# Patient Record
Sex: Female | Born: 1975
Health system: Southern US, Community
[De-identification: ages and names within clinical notes are randomized; demographics above are authoritative.]

## PROBLEM LIST (undated history)

## (undated) DIAGNOSIS — E079 Disorder of thyroid, unspecified: Secondary | ICD-10-CM

## (undated) DIAGNOSIS — Z803 Family history of malignant neoplasm of breast: Secondary | ICD-10-CM

## (undated) DIAGNOSIS — F329 Major depressive disorder, single episode, unspecified: Secondary | ICD-10-CM

## (undated) DIAGNOSIS — R2240 Localized swelling, mass and lump, unspecified lower limb: Secondary | ICD-10-CM

## (undated) DIAGNOSIS — F32A Depression, unspecified: Secondary | ICD-10-CM

## (undated) HISTORY — DX: Family history of malignant neoplasm of breast: Z80.3

## (undated) HISTORY — DX: Major depressive disorder, single episode, unspecified: F32.9

## (undated) HISTORY — DX: Disorder of thyroid, unspecified: E07.9

## (undated) HISTORY — DX: Depression, unspecified: F32.A

## (undated) HISTORY — DX: Localized swelling, mass and lump, unspecified lower limb: R22.40

---

## 1994-12-30 HISTORY — PX: FRACTURE SURGERY: SHX138

## 2001-05-12 ENCOUNTER — Other Ambulatory Visit: Admission: RE | Admit: 2001-05-12 | Discharge: 2001-05-12 | Payer: Self-pay | Admitting: Obstetrics and Gynecology

## 2001-11-23 ENCOUNTER — Inpatient Hospital Stay (HOSPITAL_COMMUNITY): Admission: AD | Admit: 2001-11-23 | Discharge: 2001-11-23 | Payer: Self-pay | Admitting: Obstetrics and Gynecology

## 2001-11-27 ENCOUNTER — Inpatient Hospital Stay: Admission: AD | Admit: 2001-11-27 | Discharge: 2001-11-27 | Payer: Self-pay | Admitting: Pediatrics

## 2001-11-30 ENCOUNTER — Inpatient Hospital Stay (HOSPITAL_COMMUNITY): Admission: AD | Admit: 2001-11-30 | Discharge: 2001-12-02 | Payer: Self-pay | Admitting: Obstetrics & Gynecology

## 2002-01-12 ENCOUNTER — Other Ambulatory Visit: Admission: RE | Admit: 2002-01-12 | Discharge: 2002-01-12 | Payer: Self-pay | Admitting: Obstetrics and Gynecology

## 2003-02-15 ENCOUNTER — Other Ambulatory Visit: Admission: RE | Admit: 2003-02-15 | Discharge: 2003-02-15 | Payer: Self-pay | Admitting: Obstetrics and Gynecology

## 2004-04-23 ENCOUNTER — Other Ambulatory Visit: Admission: RE | Admit: 2004-04-23 | Discharge: 2004-04-23 | Payer: Self-pay | Admitting: Obstetrics and Gynecology

## 2005-10-22 ENCOUNTER — Other Ambulatory Visit: Admission: RE | Admit: 2005-10-22 | Discharge: 2005-10-22 | Payer: Self-pay | Admitting: Obstetrics and Gynecology

## 2009-02-24 ENCOUNTER — Other Ambulatory Visit: Admission: RE | Admit: 2009-02-24 | Discharge: 2009-02-24 | Payer: Self-pay | Admitting: Family Medicine

## 2010-05-30 ENCOUNTER — Other Ambulatory Visit: Admission: RE | Admit: 2010-05-30 | Discharge: 2010-05-30 | Payer: Self-pay | Admitting: Family Medicine

## 2011-06-03 ENCOUNTER — Other Ambulatory Visit (HOSPITAL_COMMUNITY)
Admission: RE | Admit: 2011-06-03 | Discharge: 2011-06-03 | Disposition: A | Discharge status: Home / Self Care | Payer: BC Managed Care – PPO | Source: Ambulatory Visit | Attending: Family Medicine | Admitting: Family Medicine

## 2011-06-03 ENCOUNTER — Other Ambulatory Visit: Payer: Self-pay | Admitting: Physician Assistant

## 2011-06-03 DIAGNOSIS — Z124 Encounter for screening for malignant neoplasm of cervix: Secondary | ICD-10-CM | POA: Insufficient documentation

## 2011-07-18 ENCOUNTER — Encounter (INDEPENDENT_AMBULATORY_CARE_PROVIDER_SITE_OTHER): Payer: Self-pay | Admitting: General Surgery

## 2011-07-19 ENCOUNTER — Ambulatory Visit (INDEPENDENT_AMBULATORY_CARE_PROVIDER_SITE_OTHER): Payer: BC Managed Care – PPO | Admitting: General Surgery

## 2011-07-19 ENCOUNTER — Encounter (INDEPENDENT_AMBULATORY_CARE_PROVIDER_SITE_OTHER): Payer: Self-pay | Admitting: General Surgery

## 2011-07-19 VITALS — BP 110/68 | HR 88 | Temp 97.1°F | Resp 18 | Ht 62.0 in | Wt 201.0 lb

## 2011-07-19 DIAGNOSIS — I83899 Varicose veins of unspecified lower extremities with other complications: Secondary | ICD-10-CM

## 2011-07-19 DIAGNOSIS — I83893 Varicose veins of bilateral lower extremities with other complications: Secondary | ICD-10-CM

## 2011-07-19 NOTE — Progress Notes (Signed)
Subjective:     Patient ID: Emma [name], female   DOB: 1976-11-17, 35 y.o.   MRN: [id number]  HPI  At the 66 year old mother of 2 is referred by Dr. Sherlyn Lick from [facility] for a subcutaneous mass on the left leg that she first noticed approximately 2 months ago she said that when it first was noticed it was about the size of a quarter since then its increased and looks like it's convoluted tortuous dilated superficial varicose vein to me that she's not had any previous problems or varicose veins or wear him. She has a low particular pain suspicious trying to lose weight and she's also birth control pills. A Dr. Mignon Pine examine her and he was in agreement that this is a superficial thrombosis vein the area is slightly tender and on ultrasound and you can see its vascular structure that looked like a scalpel superficial clot in it. Review of Systems Patient is on birth control pills has had no real problems with her extremities as far as significant pain but stated that she has noticed that she is having fluid retention and swelling in her lower extremities more so recently with the hot weather. She did not have problems with death of varicose veins with her 2 pregnancies and her last pregnancy was 35 years old.    Objective:   Physical Exam Physical examination but are limited to the lower extremities she is not got really pitting edema and on examination of her leg standing and lying flat little area does increase in size with a tourniquet and with her standing that will decreased slightly. Area is slightly tender and you can feel some deeper veins at are not painful in the posterior calf. I cannot appreciate any varicosities in the this superficial venous system. In the right lower extremity in a symmetrical area you can visualize the vein but this doesn't have a thrombus in it.  A copy of the ultrasound was placed in her chart.  Her pulses are good she doesn't having problems or  claudication.     Assessment:        Plan:     However recommend that we start taking of adult aspirin one or 2 times a day try to avoid prolonged sitting S4s wearing support stockings I would not do the abdomen is hot weather. I do want to reexamine her approximately 2 weeks and if the area is decrease in on its on and probably nothing further will need to be done discussed with her that the varicose vein surgery is now performed by the peripheral asked the surgeon's and if she is having flow problems may need to see Dr. earlier Hart [place].  I like to see her for followup and then we will go accordingly the little area is large enough that it may need to be lanced all removed for cosmetic reasons if it doesn't resolve on its signs like to try the conservative management first thank you

## 2011-07-26 ENCOUNTER — Ambulatory Visit (INDEPENDENT_AMBULATORY_CARE_PROVIDER_SITE_OTHER): Payer: Self-pay | Admitting: General Surgery

## 2011-08-08 ENCOUNTER — Encounter (INDEPENDENT_AMBULATORY_CARE_PROVIDER_SITE_OTHER): Payer: Self-pay | Admitting: General Surgery

## 2011-08-09 ENCOUNTER — Encounter (INDEPENDENT_AMBULATORY_CARE_PROVIDER_SITE_OTHER): Payer: BC Managed Care – PPO | Admitting: General Surgery

## 2011-09-10 ENCOUNTER — Encounter (INDEPENDENT_AMBULATORY_CARE_PROVIDER_SITE_OTHER): Payer: BC Managed Care – PPO | Admitting: General Surgery

## 2011-10-02 ENCOUNTER — Encounter (INDEPENDENT_AMBULATORY_CARE_PROVIDER_SITE_OTHER): Payer: BC Managed Care – PPO | Admitting: General Surgery

## 2011-10-22 ENCOUNTER — Encounter (INDEPENDENT_AMBULATORY_CARE_PROVIDER_SITE_OTHER): Payer: Self-pay | Admitting: General Surgery

## 2011-10-22 ENCOUNTER — Ambulatory Visit (INDEPENDENT_AMBULATORY_CARE_PROVIDER_SITE_OTHER): Payer: BC Managed Care – PPO | Admitting: General Surgery

## 2011-10-22 VITALS — BP 128/86 | HR 100 | Temp 96.9°F | Resp 16 | Ht 62.0 in | Wt 189.8 lb

## 2011-10-22 DIAGNOSIS — I8310 Varicose veins of unspecified lower extremity with inflammation: Secondary | ICD-10-CM

## 2011-10-22 DIAGNOSIS — I8312 Varicose veins of left lower extremity with inflammation: Secondary | ICD-10-CM

## 2011-10-22 NOTE — Progress Notes (Signed)
Subjective:     Patient ID: Emma [name], female   DOB: 06/22/1976, 35 y.o.   MRN: [id number]  HPIThis is a 35 year old female that I saw in July when she had a lump arising on the pretibial area just to the left midline that so I felt like a thrombosed varicose vein and we did ultrasound of the area and I think I got my partners to reexamine her subcutaneous mass and I recommend that we not removed the area that place her on aspirin twice a day some compression to the air the examiner she returns now approximately 3 months later and the actual mass has disappeared but there is an area of erythema right at the lateral edges to come is a broad area but there is no definite mass and when you examine the area with the ultrasound you again and see the vein tortuous and through the area but it does not enlarge in size when she stands like it use to. I think that this pain is gross and and she is asking about a little red edges and I had Dr. Daphine Deutscher examiner and he was not as impressive and it was very varicose veins but he was did not see her the last visit but was of the opinion that he would not recommend any type of attempt at sclerotherapy of injecting the varicose vein since the patient's concern now is as more of a slight area of erythema. She has lost 11 pounds with diet and this time for a is on thyroid with placement medication but no evidence of any of the skin areas. Dr. Daphine Deutscher was one about a little punch biopsy of ischemia since the erythema was noticed to be some type of skin fenestration of her endocrine problem but I expected with a level of the varicose vein that had sclerosed spontaneously. The patient does not have significant varicose veins in the greater saphenous or lesser saphenous in that area and we never appreciated an area of traumatic injury to the area  Review of Systems Current Outpatient Prescriptions  Medication Sig Dispense Refill  . levonorgestrel-ethinyl estradiol (ENPRESSE)  per tablet Take 1 tablet by mouth daily.        Marland Kitchen levothyroxine (SYNTHROID, LEVOTHROID) 50 MCG tablet Take 50 mcg by mouth daily.        . sertraline (ZOLOFT) 50 MG tablet Take 50 mg by mouth daily.             Objective:   Physical ExamBP 128/86  Pulse 100  Temp(Src) 96.9 F (36.1 C) (Temporal)  Resp 16  Ht 5\' 2"  (1.575 m)  Wt 189 lb 12.8 oz (86.093 kg)  BMI 34.71 kg/m2  Patient has a erythematous measuring abut 2-1/2 inches high about 2 inches that you can feelVein inner and superiorly and to the lateral aspect in the erythema blanches a few elevated her leg is more prominent when is in a dependent position. It's not acutely tender likely has a low-grade infection in our recommended we just reexamine her in 2 months if the area is resolved and not do any biopsy or injections of the varicose vein at this time. She needs to be encouraged for weight loss and hopefully will continue on this weight diet and pattern and see me in 2 months. I did not stripper down but she states she has no other areas on the lower extremity is other areas her body a similar little erythematous area.    Assessment:  Resolve and cluster of varicose veins left lower extremity is slight erythema of the surrounding skin. The low pole varix appeared just to be decrease in inside documented with the ultrasound     Plan:     Return in 2 months

## 2011-10-22 NOTE — Patient Instructions (Signed)
Continue with her diet and lower leg management in the PCU in 2 months. If the peritoneum off of edema is increasing in return to see me sooner.

## 2011-12-20 ENCOUNTER — Encounter (INDEPENDENT_AMBULATORY_CARE_PROVIDER_SITE_OTHER): Payer: Self-pay | Admitting: General Surgery

## 2011-12-20 ENCOUNTER — Ambulatory Visit (INDEPENDENT_AMBULATORY_CARE_PROVIDER_SITE_OTHER): Payer: BC Managed Care – PPO | Admitting: General Surgery

## 2011-12-20 VITALS — BP 118/90 | HR 80 | Temp 97.0°F | Resp 18 | Ht 62.0 in | Wt 193.2 lb

## 2011-12-20 DIAGNOSIS — I8312 Varicose veins of left lower extremity with inflammation: Secondary | ICD-10-CM | POA: Insufficient documentation

## 2011-12-20 DIAGNOSIS — I8310 Varicose veins of unspecified lower extremity with inflammation: Secondary | ICD-10-CM

## 2011-12-20 NOTE — Progress Notes (Signed)
Patient ID: Emma [name], female   DOB: 1976/04/30, 35 y.o.   MRN: [id number] BP 118/90  Pulse 80  Temp(Src) 97 F (36.1 C) (Temporal)  Resp 18  Ht 5\' 2"  (1.575 m)  Wt 193 lb 3.2 oz (87.635 kg)  BMI 35.34 kg/m2 Emma [name] returns and states that the area on the left leg has not been a swollen but there still are area of mild erythema of a central area of that no longer erythematous but she is developed venous varicosities around the ankle and in other areas of the left leg on ultrasound of the area today you can see nondilated vein and the immediate subcutaneous area but there is no definite mass lipoma-like and feeling area and a few place a tourniquet of pain have her stay on the veins to become more prominent. I think that this is a problem of venous varicosities and would like for her to be evaluated by Dr. Gretta Began and see if problems could be prevented with the laser outpatient surgery or at least tall to him. She states she is trying to lose weight without much succes but is not planning any future pregnancies. When I originally first saw her there was an area of swelling like acute venous varicosities these have resolved but there still this mild area of erythema with slight central discoloration of the skin. I did not take any photographs today. The varicosities on the right lower extremity are much less. Dr. early feels that this is not a problem with venous varicosities Dr. Mignon Pine has seen Emma. [name] previously with me and she will call to be seen by Dr. Mignon Pine if things become worse.

## 2011-12-20 NOTE — Patient Instructions (Signed)
Calling upon with Dr. Tawanna Cooler Early in the next few weeks so he can do an ultrasound evaluation and decide high at this point the best managed with the ultrasound. If they feel that this is not a problem that they can manage return to see Dr. Luisa Hart reviewed met with the previous visit

## 2012-06-04 ENCOUNTER — Ambulatory Visit (INDEPENDENT_AMBULATORY_CARE_PROVIDER_SITE_OTHER): Payer: BC Managed Care – PPO | Admitting: Emergency Medicine

## 2012-06-04 VITALS — BP 107/71 | HR 80 | Temp 98.0°F | Resp 16 | Ht 63.0 in | Wt 192.0 lb

## 2012-06-04 DIAGNOSIS — J029 Acute pharyngitis, unspecified: Secondary | ICD-10-CM

## 2012-06-04 DIAGNOSIS — J018 Other acute sinusitis: Secondary | ICD-10-CM

## 2012-06-04 MED ORDER — DM-GUAIFENESIN ER 60-1200 MG PO TB12: 1.0000 | ORAL_TABLET | Freq: Two times a day (BID) | ORAL | Status: AC

## 2012-06-04 MED ORDER — AMOXICILLIN-POT CLAVULANATE 875-125 MG PO TABS: 1.0000 | ORAL_TABLET | Freq: Two times a day (BID) | ORAL | Status: AC

## 2012-06-04 NOTE — Progress Notes (Signed)
  Subjective:    Patient ID: Emma [name], female    DOB: [birth year], 36 y.o.   MRN: [id number]  Fever  This is a new problem. The current episode started yesterday. The problem occurs constantly. The problem has been gradually worsening. The maximum temperature noted was 102 to 102.9 F. The temperature was taken using an oral thermometer. Associated symptoms include congestion, coughing, diarrhea, ear pain and a sore throat. Pertinent negatives include no headaches. She has tried nothing for the symptoms. The treatment provided no relief.  Sore Throat  This is a new problem. The current episode started yesterday. The problem has been gradually worsening. Neither side of throat is experiencing more pain than the other. The maximum temperature recorded prior to her arrival was 102 - 102.9 F. The pain is at a severity of 3/10. The pain is moderate. Associated symptoms include congestion, coughing, diarrhea, ear pain and a hoarse voice. Pertinent negatives include no headaches, neck pain or shortness of breath. She has tried nothing for the symptoms.  Otalgia  Associated symptoms include coughing, diarrhea and a sore throat. Pertinent negatives include no headaches or neck pain.  Diarrhea  Associated symptoms include coughing and a fever. Pertinent negatives include no chills or headaches.  Sinusitis This is a new problem. The current episode started yesterday. The problem has been gradually worsening since onset. The maximum temperature recorded prior to her arrival was 101 - 101.9 F. The fever has been present for 1 to 2 days. Her pain is at a severity of 4/10. Associated symptoms include congestion, coughing, ear pain, a hoarse voice, sinus pressure and a sore throat. Pertinent negatives include no chills, headaches, neck pain or shortness of breath. Past treatments include nothing.      Review of Systems  Constitutional: Positive for fever. Negative for chills.  HENT: Positive for ear pain,  congestion, sore throat, hoarse voice and sinus pressure. Negative for neck pain.   Eyes: Negative.   Respiratory: Positive for cough. Negative for shortness of breath.   Cardiovascular: Negative.   Gastrointestinal: Positive for diarrhea.  Genitourinary: Negative.   Musculoskeletal: Negative.   Neurological: Negative for headaches.       Objective:   Physical Exam  Constitutional: She is oriented to person, place, and time. She appears well-developed and well-nourished.  HENT:  Head: Normocephalic and atraumatic.  Right Ear: External ear normal.  Left Ear: External ear normal.  Nose: Mucosal edema present. Right sinus exhibits maxillary sinus tenderness. Left sinus exhibits maxillary sinus tenderness.  Mouth/Throat: Posterior oropharyngeal erythema present. No oropharyngeal exudate or tonsillar abscesses.  Eyes: Conjunctivae and EOM are normal. Pupils are equal, round, and reactive to light.  Neck: Normal range of motion.  Cardiovascular: Normal rate, regular rhythm and normal heart sounds.   Pulmonary/Chest: Effort normal and breath sounds normal.  Abdominal: Soft.  Musculoskeletal: Normal range of motion.  Neurological: She is alert and oriented to person, place, and time.  Skin: Skin is warm and dry.          Assessment & Plan:

## 2012-06-05 ENCOUNTER — Telehealth: Payer: Self-pay

## 2012-06-05 NOTE — Telephone Encounter (Signed)
Pt would like another work note for today she still has a fever this morning please call her and let her know if this is ok

## 2013-03-04 ENCOUNTER — Other Ambulatory Visit (HOSPITAL_COMMUNITY)
Admission: RE | Admit: 2013-03-04 | Discharge: 2013-03-04 | Disposition: A | Discharge status: Home / Self Care | Payer: BC Managed Care – PPO | Source: Ambulatory Visit | Attending: Family Medicine | Admitting: Family Medicine

## 2013-03-04 ENCOUNTER — Other Ambulatory Visit: Payer: Self-pay | Admitting: Physician Assistant

## 2013-03-04 DIAGNOSIS — Z124 Encounter for screening for malignant neoplasm of cervix: Secondary | ICD-10-CM | POA: Insufficient documentation

## 2014-06-08 ENCOUNTER — Other Ambulatory Visit: Payer: Self-pay | Admitting: Physician Assistant

## 2014-06-08 ENCOUNTER — Other Ambulatory Visit (HOSPITAL_COMMUNITY)
Admission: RE | Admit: 2014-06-08 | Discharge: 2014-06-08 | Disposition: A | Discharge status: Home / Self Care | Payer: No Typology Code available for payment source | Source: Ambulatory Visit | Attending: Family Medicine | Admitting: Family Medicine

## 2014-06-08 DIAGNOSIS — Z124 Encounter for screening for malignant neoplasm of cervix: Secondary | ICD-10-CM | POA: Insufficient documentation

## 2014-06-13 LAB — CYTOLOGY - PAP

## 2015-07-24 ENCOUNTER — Emergency Department (HOSPITAL_COMMUNITY)
Admission: EM | Admit: 2015-07-24 | Discharge: 2015-07-24 | Disposition: A | Discharge status: Home / Self Care | Payer: 59 | Attending: Emergency Medicine | Admitting: Emergency Medicine

## 2015-07-24 ENCOUNTER — Emergency Department (HOSPITAL_COMMUNITY): Payer: 59

## 2015-07-24 ENCOUNTER — Encounter (HOSPITAL_COMMUNITY): Payer: Self-pay | Admitting: Emergency Medicine

## 2015-07-24 DIAGNOSIS — N838 Other noninflammatory disorders of ovary, fallopian tube and broad ligament: Secondary | ICD-10-CM | POA: Diagnosis not present

## 2015-07-24 DIAGNOSIS — R1011 Right upper quadrant pain: Secondary | ICD-10-CM

## 2015-07-24 DIAGNOSIS — E079 Disorder of thyroid, unspecified: Secondary | ICD-10-CM | POA: Diagnosis not present

## 2015-07-24 DIAGNOSIS — Z3202 Encounter for pregnancy test, result negative: Secondary | ICD-10-CM | POA: Insufficient documentation

## 2015-07-24 DIAGNOSIS — Z79899 Other long term (current) drug therapy: Secondary | ICD-10-CM | POA: Diagnosis not present

## 2015-07-24 DIAGNOSIS — R61 Generalized hyperhidrosis: Secondary | ICD-10-CM | POA: Diagnosis not present

## 2015-07-24 DIAGNOSIS — Z720 Tobacco use: Secondary | ICD-10-CM | POA: Insufficient documentation

## 2015-07-24 DIAGNOSIS — N949 Unspecified condition associated with female genital organs and menstrual cycle: Secondary | ICD-10-CM

## 2015-07-24 DIAGNOSIS — F329 Major depressive disorder, single episode, unspecified: Secondary | ICD-10-CM | POA: Insufficient documentation

## 2015-07-24 LAB — COMPREHENSIVE METABOLIC PANEL
ALT: 13 U/L — ABNORMAL LOW (ref 14–54)
AST: 18 U/L (ref 15–41)
Albumin: 3 g/dL — ABNORMAL LOW (ref 3.5–5.0)
Alkaline Phosphatase: 49 U/L (ref 38–126)
Anion gap: 9 (ref 5–15)
BILIRUBIN TOTAL: 0.2 mg/dL — AB (ref 0.3–1.2)
BUN: 8 mg/dL (ref 6–20)
CO2: 23 mmol/L (ref 22–32)
CREATININE: 0.68 mg/dL (ref 0.44–1.00)
Calcium: 8.6 mg/dL — ABNORMAL LOW (ref 8.9–10.3)
Chloride: 106 mmol/L (ref 101–111)
GFR calc non Af Amer: >60 mL/min (ref >60)
Glucose, Bld: 102 mg/dL — ABNORMAL HIGH (ref 65–99)
POTASSIUM: 4.1 mmol/L (ref 3.5–5.1)
Sodium: 138 mmol/L (ref 135–145)
Total Protein: 6.5 g/dL (ref 6.5–8.1)

## 2015-07-24 LAB — CBC WITH DIFFERENTIAL/PLATELET
BASOS PCT: 0 % (ref 0–1)
Basophils Absolute: 0 10*3/uL (ref 0.0–0.1)
EOS ABS: 0.1 10*3/uL (ref 0.0–0.7)
Eosinophils Relative: 1 % (ref 0–5)
HEMATOCRIT: 37.1 % (ref 36.0–46.0)
HEMOGLOBIN: 12.1 g/dL (ref 12.0–15.0)
LYMPHS ABS: 4 10*3/uL (ref 0.7–4.0)
Lymphocytes Relative: 36 % (ref 12–46)
MCH: 31.8 pg (ref 26.0–34.0)
MCHC: 32.6 g/dL (ref 30.0–36.0)
MCV: 97.4 fL (ref 78.0–100.0)
MONO ABS: 0.5 10*3/uL (ref 0.1–1.0)
Monocytes Relative: 5 % (ref 3–12)
NEUTROS PCT: 58 % (ref 43–77)
Neutro Abs: 6.4 10*3/uL (ref 1.7–7.7)
Platelets: 383 10*3/uL (ref 150–400)
RBC: 3.81 MIL/uL — ABNORMAL LOW (ref 3.87–5.11)
RDW: 14.3 % (ref 11.5–15.5)
WBC: 11 10*3/uL — ABNORMAL HIGH (ref 4.0–10.5)

## 2015-07-24 LAB — URINALYSIS, ROUTINE W REFLEX MICROSCOPIC
GLUCOSE, UA: NEGATIVE mg/dL
HGB URINE DIPSTICK: NEGATIVE
Ketones, ur: 15 mg/dL — AB
Leukocytes, UA: NEGATIVE
NITRITE: NEGATIVE
PH: 5.5 (ref 5.0–8.0)
Protein, ur: NEGATIVE mg/dL
Specific Gravity, Urine: 1.025 (ref 1.005–1.030)
Urobilinogen, UA: 0.2 mg/dL (ref 0.0–1.0)

## 2015-07-24 LAB — I-STAT BETA HCG BLOOD, ED (MC, WL, AP ONLY): I-stat hCG, quantitative: <5 m[IU]/mL (ref <5)

## 2015-07-24 LAB — LIPASE, BLOOD: Lipase: 43 U/L (ref 22–51)

## 2015-07-24 MED ORDER — HYDROCODONE-ACETAMINOPHEN 5-325 MG PO TABS: 1.0000 | ORAL_TABLET | Freq: Four times a day (QID) | ORAL | Status: DC | PRN

## 2015-07-24 MED ORDER — SODIUM CHLORIDE 0.9 % IV BOLUS (SEPSIS)
1000.0000 mL | Freq: Once | INTRAVENOUS | Status: AC
  Administered 2015-07-24: 1000 mL via INTRAVENOUS

## 2015-07-24 MED ORDER — IOHEXOL 300 MG/ML  SOLN: 25.0000 mL | Freq: Once | INTRAMUSCULAR | Status: DC | PRN

## 2015-07-24 MED ORDER — IOHEXOL 300 MG/ML  SOLN
100.0000 mL | Freq: Once | INTRAMUSCULAR | Status: AC | PRN
  Administered 2015-07-24: 100 mL via INTRAVENOUS

## 2015-07-24 MED ORDER — FENTANYL CITRATE (PF) 100 MCG/2ML IJ SOLN
100.0000 ug | Freq: Once | INTRAMUSCULAR | Status: AC
  Administered 2015-07-24: 100 ug via INTRAVENOUS

## 2015-07-24 MED ORDER — HYDROMORPHONE HCL 1 MG/ML IJ SOLN
1.0000 mg | Freq: Once | INTRAMUSCULAR | Status: AC
  Administered 2015-07-24: 1 mg via INTRAVENOUS
  Filled 2015-07-24: qty 1

## 2015-07-24 MED ORDER — FENTANYL CITRATE (PF) 100 MCG/2ML IJ SOLN
INTRAMUSCULAR | Status: AC
  Filled 2015-07-24: qty 2

## 2015-07-24 MED ORDER — TECHNETIUM TC 99M MEBROFENIN IV KIT
5.0000 | PACK | Freq: Once | INTRAVENOUS | Status: AC | PRN
  Administered 2015-07-24: 5 via INTRAVENOUS

## 2015-07-24 MED ORDER — ONDANSETRON 4 MG PO TBDP: ORAL_TABLET | ORAL | Status: DC

## 2015-07-24 MED ORDER — ONDANSETRON HCL 4 MG/2ML IJ SOLN
4.0000 mg | Freq: Once | INTRAMUSCULAR | Status: AC
  Administered 2015-07-24: 4 mg via INTRAVENOUS
  Filled 2015-07-24: qty 2

## 2015-07-24 NOTE — ED Notes (Signed)
Dr. Norlene Campbell at bedside.  Pt declines any more pain medication at this time.  Oral swab given for pt's mouth.  Awaiting CT.

## 2015-07-24 NOTE — ED Provider Notes (Signed)
  Physical Exam  BP 113/60 mmHg  Pulse 67  Temp(Src) 97.7 F (36.5 C) (Oral)  Resp 12  SpO2 96%  LMP 07/03/2015 (Approximate)  Physical Exam  ED Course  Procedures  MDM Care assumed at sign out from Dr. Norlene Campbell at 7am. ? Pericholecystic fluid on CT. Surgery consulted and request HIDA. HIDA showed nl gallbladder. I discussed case with Dr. Lindie Spruce prior to discharge. Patient's pain improved. Updated her with results. Consider possible gastro. Also has adenxal cyst but not tender in LLQ. Will have her f/u outpatient. Will dc home with vicodin, zofran.   Richardean Canal, MD 07/24/15 [phone]

## 2015-07-24 NOTE — ED Provider Notes (Signed)
CSN: [id number]     Arrival date & time 07/24/15  0220 History  This chart was scribed for Marisa Severin, MD by Evon Slack, ED Scribe. This patient was seen in room A10C/A10C and the patient's care was started at 3:00 AM.     Chief Complaint  Patient presents with  . Abdominal Pain   Patient is a 39 y.o. female presenting with abdominal pain. The history is provided by the patient. No language interpreter was used.  Abdominal Pain Associated symptoms: nausea and vomiting   Associated symptoms: no diarrhea and no dysuria    HPI Comments: LABRESHA MELLOR is a 39 y.o. female who presents to the Emergency Department complaining of new constant upper abdominal pain onset 1 AM. Pt states that she has associated nausea and vomiting x1. Pt states that the pain is non radiating. Pt does report eating [employer] for dinner tonight. Pt denies any abdominal surgeries. Pt denies dysuria, frequency or diarrhea. LMP 3 weeks prior.   Past Medical History  Diagnosis Date  . Depression   . Thyroid disease   . Family history of breast cancer   . Leg mass     left   Past Surgical History  Procedure Laterality Date  . Fracture surgery  1996    right hand   Family History  Problem Relation Age of Onset  . Cancer Mother     breast and ovarian  . Hypertension Mother   . Diabetes Mother   . Hypertension Father   . Cancer Maternal Grandmother     colon   History  Substance Use Topics  . Smoking status: Current Every Day Smoker -- 0.50 packs/day  . Smokeless tobacco: Not on file  . Alcohol Use: 1.2 oz/week    2 Glasses of wine per week     Comment: socially   OB History    No data available     Review of Systems  Gastrointestinal: Positive for nausea, vomiting and abdominal pain. Negative for diarrhea.  Genitourinary: Negative for dysuria and frequency.  All other systems reviewed and are negative.     Allergies  Review of patient's allergies indicates no known allergies.  Home  Medications   Prior to Admission medications   Medication Sig Start Date End Date Taking? Authorizing Provider  levonorgestrel-ethinyl estradiol (ENPRESSE) per tablet Take 1 tablet by mouth daily.      Historical Provider, MD  levothyroxine (SYNTHROID, LEVOTHROID) 50 MCG tablet Take 50 mcg by mouth daily.      Historical Provider, MD  sertraline (ZOLOFT) 50 MG tablet Take 50 mg by mouth daily.      Historical Provider, MD   BP 151/93 mmHg  Pulse 83  Temp(Src) 97.7 F (36.5 C) (Oral)  Resp 20  SpO2 99%  LMP 07/03/2015 (Approximate)   Physical Exam  Constitutional: She is oriented to person, place, and time. She appears well-developed and well-nourished. She appears distressed.  HENT:  Head: Normocephalic and atraumatic.  Nose: Nose normal.  Mouth/Throat: Oropharynx is clear and moist.  Eyes: Conjunctivae and EOM are normal. Pupils are equal, round, and reactive to light.  Neck: Normal range of motion. Neck supple. No JVD present. No tracheal deviation present. No thyromegaly present.  Cardiovascular: Normal rate, regular rhythm, normal heart sounds and intact distal pulses.  Exam reveals no gallop and no friction rub.   No murmur heard. Pulmonary/Chest: Effort normal and breath sounds normal. No stridor. No respiratory distress. She has no wheezes. She has no rales.  She exhibits no tenderness.  Abdominal: Soft. Bowel sounds are normal. She exhibits no distension and no mass. There is tenderness (TTP over epigastrium, RUQ pain). There is no rebound and no guarding.  Musculoskeletal: Normal range of motion. She exhibits no edema or tenderness.  Lymphadenopathy:    She has no cervical adenopathy.  Neurological: She is alert and oriented to person, place, and time. She displays normal reflexes. She exhibits normal muscle tone. Coordination normal.  Skin: Skin is warm. No rash noted. She is diaphoretic. No erythema. No pallor.  Psychiatric: She has a normal mood and affect. Her behavior is  normal. Judgment and thought content normal.  Nursing note and vitals reviewed.   ED Course  Procedures (including critical care time) DIAGNOSTIC STUDIES: Oxygen Saturation is 99% on RA, normal by my interpretation.    COORDINATION OF CARE: 3:13 AM-Discussed treatment plan with pt at bedside and pt agreed to plan.     Labs Review Labs Reviewed  CBC WITH DIFFERENTIAL/PLATELET - Abnormal; Notable for the following:    WBC 11.0 (*)    RBC 3.81 (*)    All other components within normal limits  COMPREHENSIVE METABOLIC PANEL - Abnormal; Notable for the following:    Glucose, Bld 102 (*)    Calcium 8.6 (*)    Albumin 3.0 (*)    ALT 13 (*)    Total Bilirubin 0.2 (*)    All other components within normal limits  URINALYSIS, ROUTINE W REFLEX MICROSCOPIC (NOT AT [facility]) - Abnormal; Notable for the following:    APPearance CLOUDY (*)    Bilirubin Urine SMALL (*)    Ketones, ur 15 (*)    All other components within normal limits  LIPASE, BLOOD  POC URINE PREG, ED  I-STAT BETA HCG BLOOD, ED ([facility], [facility], [facility] ONLY)    Imaging Review Ct Abdomen Pelvis W Contrast  07/24/2015   CLINICAL DATA:  Awoke with nausea and vomiting.  Abdominal pain.  EXAM: CT ABDOMEN AND PELVIS WITH CONTRAST  TECHNIQUE: Multidetector CT imaging of the abdomen and pelvis was performed using the standard protocol following bolus administration of intravenous contrast.  CONTRAST:  OMNIPAQUE IOHEXOL 300 MG/ML  SOLN  COMPARISON:  Right upper quadrant ultrasound earlier this day.  FINDINGS: The included lung bases are clear.  Mild hepatomegaly and hepatic steatosis. The gallbladder is physiologically distended. Question of minimal pericholecystic fluid, not seen on prior ultrasound. No biliary dilatation. The spleen, pancreas, and adrenal glands are normal. There is symmetric renal enhancement and excretion without hydronephrosis or localizing renal abnormality.  The stomach is physiologically distended. There are no dilated or  thickened bowel loops. The appendix is normal. Small to moderate volume of colonic stool without colonic wall thickening. Sigmoid colon is tortuous in its course. No free air or intra-abdominal fluid collection.  No retroperitoneal adenopathy. Abdominal aorta is normal in caliber. Small fat containing umbilical hernia.  Urinary bladder is physiologically distended. Uterus and right adnexa are normal. There is a 3.2 cm left ovarian cyst. No pelvic free fluid. No pelvic adenopathy.  There are no acute or suspicious osseous abnormalities. Mild degenerative change in the lower lumbar spine with facet arthropathy.  IMPRESSION: 1. Question of small volume of pericholecystic fluid, not appreciated on prior ultrasound. Gallbladder is physiologically distended. If there is clinical concern for acute cholecystitis, nuclear medicine hepatobiliary scan could be considered. 2. Hepatomegaly and hepatic steatosis. 3. Left adnexal cyst measuring 3.2 cm. In the absence of symptoms in this region, this  is of doubtful clinical significance.   Electronically Signed   By: Rubye Oaks M.D.   On: 07/24/2015 06:18   US Abdomen Limited Ruq  07/24/2015   CLINICAL DATA:  Right upper quadrant abdominal pain. Emesis. Symptoms for 6 hours.  EXAM: US ABDOMEN LIMITED - RIGHT UPPER QUADRANT  COMPARISON:  None.  FINDINGS: Gallbladder:  No gallstones or wall thickening visualized. No sonographic Murphy sign noted.  Common bile duct:  Diameter: 4.8 mm proximally, 2-3 mm distally.  Liver:  No focal lesion identified. Diffusely increased and patchy in parenchymal echogenicity. Normal directional flow in the main portal vein.  IMPRESSION: 1. Normal appearance of the gallbladder and biliary tree. 2. Hepatic steatosis.   Electronically Signed   By: Rubye Oaks M.D.   On: 07/24/2015 03:54     EKG Interpretation None      MDM   Final diagnoses:  None      I personally performed the services described in this documentation, which  was scribed in my presence. The recorded information has been reviewed and is accurate.  39 year old female presents with acute onset of right upper abdominal pain, nausea and vomiting.  Pain is severe, sharp and unrelenting.  No prior history of same.  Concern for cholelithiasis.  Plan for ultrasound.  Labs, pain control.  4:36 AM U/s without gallstones.  Will proceed with CT abd/pelvis with for further evaluation, pt still with pain.  6:51 AM Case discussed with Dr Corliss Skains, general surgery.  Patient with CT scan with pericholecystic fluid.  Recommended HIDA scan, which will I will order.  Surgery to see.  Marisa Severin, MD 07/24/15 [phone]

## 2015-07-24 NOTE — Consult Note (Signed)
Reason for Consult: RUQ abdominal pain Referring Physician: Dr. Linton Flemings    HPI: Emma [name] is a 39 year old female with a history of depression and hypothyroidism presenting with sudden onset RUQ abdominal pain.  It started at 0130.  Denies previous symptoms.  Severe in severity.  Time pattern is constant.   Characterized as sharp pain without radiation.  She had [employer] at about 9PM last night.  Associated symptoms include; nausea.  Denies fever, chills or sweats.  Denies diarrhea or constipation.  Denies history of ulcers.  She takes NSAIDs occasionally and admits to drinking 1 beer each night, 1/2ppd smoker.  No modifying factors.  No alleviating or aggravating factors.  Pain is better now after 2 doses of dilaudid.  Her work up shows.  WBC 11k.  [place] of abdomen was essentially normal.  CT of A/P showed small pericholecystic fluid, distended gallbladder.  We have therefore been asked to evaluate.   Past Medical History  Diagnosis Date  . Depression   . Thyroid disease   . Family history of breast cancer   . Leg mass     left    Past Surgical History  Procedure Laterality Date  . Fracture surgery  1996    right hand    Family History  Problem Relation Age of Onset  . Cancer Mother     breast and ovarian  . Hypertension Mother   . Diabetes Mother   . Hypertension Father   . Cancer Maternal Grandmother     colon    Social History:  reports that she has been smoking.  She does not have any smokeless tobacco history on file. She reports that she drinks about 1.2 oz of alcohol per week. She reports that she does not use illicit drugs.  Allergies: No Known Allergies  Medications:   Prior to Admission medications   Medication Sig Start Date End Date Taking? Authorizing Provider  ALPRAZolam (XANAX) 0.25 MG tablet Take 0.25 mg by mouth 2 (two) times daily as needed for anxiety.    Yes Historical Provider, MD  DiphenhydrAMINE HCl, Sleep, (ZZZQUIL) 25 MG CAPS Take 2 capsules by  mouth at bedtime as needed (sleep).   Yes Historical Provider, MD  Ninetta Lights Triphasic (ENPRESSE-28 PO) Take 1 tablet by mouth daily.   Yes Historical Provider, MD  levothyroxine (SYNTHROID, LEVOTHROID) 75 MCG tablet Take 75 mcg by mouth daily before breakfast.   Yes Historical Provider, MD  venlafaxine XR (EFFEXOR-XR) 150 MG 24 hr capsule Take 150 mg by mouth daily with breakfast.   Yes Historical Provider, MD     Results for orders placed or performed during the hospital encounter of 07/24/15 (from the past 48 hour(s))  CBC WITH DIFFERENTIAL     Status: Abnormal   Collection Time: 07/24/15  2:40 AM  Result Value Ref Range   WBC 11.0 (H) 4.0 - 10.5 K/uL   RBC 3.81 (L) 3.87 - 5.11 MIL/uL   Hemoglobin 12.1 12.0 - 15.0 g/dL   HCT 37.1 36.0 - 46.0 %   MCV 97.4 78.0 - 100.0 fL   MCH 31.8 26.0 - 34.0 pg   MCHC 32.6 30.0 - 36.0 g/dL   RDW 14.3 11.5 - 15.5 %   Platelets 383 150 - 400 K/uL   Neutrophils Relative % 58 43 - 77 %   Neutro Abs 6.4 1.7 - 7.7 K/uL   Lymphocytes Relative 36 12 - 46 %   Lymphs Abs 4.0 0.7 - 4.0 K/uL  Monocytes Relative 5 3 - 12 %   Monocytes Absolute 0.5 0.1 - 1.0 K/uL   Eosinophils Relative 1 0 - 5 %   Eosinophils Absolute 0.1 0.0 - 0.7 K/uL   Basophils Relative 0 0 - 1 %   Basophils Absolute 0.0 0.0 - 0.1 K/uL  Comprehensive metabolic panel     Status: Abnormal   Collection Time: 07/24/15  2:40 AM  Result Value Ref Range   Sodium 138 135 - 145 mmol/L   Potassium 4.1 3.5 - 5.1 mmol/L   Chloride 106 101 - 111 mmol/L   CO2 23 22 - 32 mmol/L   Glucose, Bld 102 (H) 65 - 99 mg/dL   BUN 8 6 - 20 mg/dL   Creatinine, Ser 0.68 0.44 - 1.00 mg/dL   Calcium 8.6 (L) 8.9 - 10.3 mg/dL   Total Protein 6.5 6.5 - 8.1 g/dL   Albumin 3.0 (L) 3.5 - 5.0 g/dL   AST 18 15 - 41 U/L   ALT 13 (L) 14 - 54 U/L   Alkaline Phosphatase 49 38 - 126 U/L   Total Bilirubin 0.2 (L) 0.3 - 1.2 mg/dL   GFR calc non Af Amer >60 >60 mL/min   GFR calc Af Amer >60 >60 mL/min     Comment: (NOTE) The eGFR has been calculated using the CKD EPI equation. This calculation has not been validated in all clinical situations. eGFR's persistently <60 mL/min signify possible Chronic Kidney Disease.    Anion gap 9 5 - 15  Lipase, blood     Status: None   Collection Time: 07/24/15  2:40 AM  Result Value Ref Range   Lipase 43 22 - 51 U/L  I-Stat beta hCG blood, ED     Status: None   Collection Time: 07/24/15  3:23 AM  Result Value Ref Range   I-stat hCG, quantitative <5.0 <5 mIU/mL   Comment 3            Comment:   GEST. AGE      CONC.  (mIU/mL)   <=1 WEEK        5 - 50     2 WEEKS       50 - 500     3 WEEKS       100 - 10,000     4 WEEKS     1,000 - 30,000        FEMALE AND NON-PREGNANT FEMALE:     LESS THAN 5 mIU/mL   Urinalysis, Routine w reflex microscopic (not at [facility])     Status: Abnormal   Collection Time: 07/24/15  3:25 AM  Result Value Ref Range   Color, Urine YELLOW YELLOW   APPearance CLOUDY (A) CLEAR   Specific Gravity, Urine 1.025 1.005 - 1.030   pH 5.5 5.0 - 8.0   Glucose, UA NEGATIVE NEGATIVE mg/dL   Hgb urine dipstick NEGATIVE NEGATIVE   Bilirubin Urine SMALL (A) NEGATIVE   Ketones, ur 15 (A) NEGATIVE mg/dL   Protein, ur NEGATIVE NEGATIVE mg/dL   Urobilinogen, UA 0.2 0.0 - 1.0 mg/dL   Nitrite NEGATIVE NEGATIVE   Leukocytes, UA NEGATIVE NEGATIVE    Comment: MICROSCOPIC NOT DONE ON URINES WITH NEGATIVE PROTEIN, BLOOD, LEUKOCYTES, NITRITE, OR GLUCOSE <1000 mg/dL.    Ct Abdomen Pelvis W Contrast  07/24/2015   CLINICAL DATA:  Awoke with nausea and vomiting.  Abdominal pain.  EXAM: CT ABDOMEN AND PELVIS WITH CONTRAST  TECHNIQUE: Multidetector CT imaging of the abdomen and pelvis  was performed using the standard protocol following bolus administration of intravenous contrast.  CONTRAST:  12m OMNIPAQUE IOHEXOL 300 MG/ML  SOLN  COMPARISON:  Right upper quadrant ultrasound earlier this day.  FINDINGS: The included lung bases are clear.  Mild hepatomegaly  and hepatic steatosis. The gallbladder is physiologically distended. Question of minimal pericholecystic fluid, not seen on prior ultrasound. No biliary dilatation. The spleen, pancreas, and adrenal glands are normal. There is symmetric renal enhancement and excretion without hydronephrosis or localizing renal abnormality.  The stomach is physiologically distended. There are no dilated or thickened bowel loops. The appendix is normal. Small to moderate volume of colonic stool without colonic wall thickening. Sigmoid colon is tortuous in its course. No free air or intra-abdominal fluid collection.  No retroperitoneal adenopathy. Abdominal aorta is normal in caliber. Small fat containing umbilical hernia.  Urinary bladder is physiologically distended. Uterus and right adnexa are normal. There is a 3.2 cm left ovarian cyst. No pelvic free fluid. No pelvic adenopathy.  There are no acute or suspicious osseous abnormalities. Mild degenerative change in the lower lumbar spine with facet arthropathy.  IMPRESSION: 1. Question of small volume of pericholecystic fluid, not appreciated on prior ultrasound. Gallbladder is physiologically distended. If there is clinical concern for acute cholecystitis, nuclear medicine hepatobiliary scan could be considered. 2. Hepatomegaly and hepatic steatosis. 3. Left adnexal cyst measuring 3.2 cm. In the absence of symptoms in this region, this is of doubtful clinical significance.   Electronically Signed   By: MJeb LeveringM.D.   On: 07/24/2015 06:18   [place]dom[place] Limited Ruq  07/24/2015   CLINICAL DATA:  Right upper quadrant abdominal pain. Emesis. Symptoms for 6 hours.  EXAM: [place]DOMEN LIMITED - RIGHT UPPER QUADRANT  COMPARISON:  None.  FINDINGS: Gallbladder:  No gallstones or wall thickening visualized. No sonographic Murphy sign noted.  Common bile duct:  Diameter: 4.8 mm proximally, 2-3 mm distally.  Liver:  No focal lesion identified. Diffusely increased and patchy in  parenchymal echogenicity. Normal directional flow in the main portal vein.  IMPRESSION: 1. Normal appearance of the gallbladder and biliary tree. 2. Hepatic steatosis.   Electronically Signed   By: MJeb LeveringM.D.   On: 07/24/2015 03:54    Review of Systems  All other systems reviewed and are negative.  Blood pressure 132/46, pulse 79, temperature 97.7 F (36.5 C), temperature source Oral, resp. rate 12, last menstrual period 07/03/2015, SpO2 96 %. Physical Exam  Constitutional: She is oriented to person, place, and time. She appears well-developed and well-nourished. No distress.  HENT:  Head: Normocephalic and atraumatic.  Mouth/Throat: No oropharyngeal exudate.  Eyes: Conjunctivae and EOM are normal. Pupils are equal, round, and reactive to light. Right eye exhibits no discharge. Left eye exhibits no discharge. No scleral icterus.  Neck: Normal range of motion. Neck supple.  Cardiovascular: Normal rate, regular rhythm, normal heart sounds and intact distal pulses.  Exam reveals no gallop and no friction rub.   No murmur heard. Respiratory: Effort normal and breath sounds normal. No respiratory distress. She has no wheezes. She has no rales. She exhibits no tenderness.  GI: Soft. Bowel sounds are normal. She exhibits no mass. There is no rebound and no guarding.  TTP to the epigastric region, RUQ.  Musculoskeletal: Normal range of motion. She exhibits no edema or tenderness.  Neurological: She is alert and oriented to person, place, and time.  Skin: Skin is warm and dry. No rash noted. She is not diaphoretic.  No erythema. No pallor.  Psychiatric: She has a normal mood and affect. Her behavior is normal. Judgment and thought content normal.    Assessment/Plan: RUQ abdominal pain: LFTs and lipase are normal.  WBC slightly elevated.  CT A/P without stones, small amount of peri cholecystitic fluid.  I'm not convinced that this is her gallbladder.  Will await HIDA scan.  Keep NPO for  now. Hold narcotics for the test.  Further surgical recommendations to follow after HIDA.   Jackqueline Aquilar ANP-BC 07/24/2015, 7:55 AM

## 2015-07-24 NOTE — Discharge Instructions (Signed)
Take zofran for nausea.   Take motrin for pain.   Take vicodin for severe pain.   Stay hydrated.   Follow up with your doctor. You have ovarian cyst that is not causing your pain but needs follow up for it.   Return to ER if you have fever, vomiting, severe pain.

## 2015-07-24 NOTE — ED Notes (Signed)
Emma [name] in CT notified that pt is finished drinking contrast.

## 2015-07-24 NOTE — ED Notes (Signed)
Patient awoke with nausea and vomiting.  Patient is actively vomiting in exam room.  Patient does have abdominal pain with the nausea and vomiting.  Patient states she did have [employer] around 9pm tonight.  Patient vomited large amount of partially digested food.

## 2015-07-24 NOTE — ED Notes (Signed)
Nuclear medicine called to advise they are sending for patient for her hepatobiliary study.   Patient will be off the floor x 2 hours.

## 2015-07-25 ENCOUNTER — Other Ambulatory Visit: Payer: Self-pay | Admitting: General Surgery

## 2015-08-09 ENCOUNTER — Encounter (HOSPITAL_COMMUNITY): Payer: Self-pay | Admitting: Family Medicine

## 2015-08-09 ENCOUNTER — Emergency Department (HOSPITAL_COMMUNITY)
Admission: EM | Admit: 2015-08-09 | Discharge: 2015-08-10 | Disposition: A | Discharge status: Home / Self Care | Payer: 59 | Attending: Emergency Medicine | Admitting: Emergency Medicine

## 2015-08-09 DIAGNOSIS — E079 Disorder of thyroid, unspecified: Secondary | ICD-10-CM | POA: Diagnosis not present

## 2015-08-09 DIAGNOSIS — R1011 Right upper quadrant pain: Secondary | ICD-10-CM | POA: Diagnosis not present

## 2015-08-09 DIAGNOSIS — Z79899 Other long term (current) drug therapy: Secondary | ICD-10-CM | POA: Insufficient documentation

## 2015-08-09 DIAGNOSIS — F329 Major depressive disorder, single episode, unspecified: Secondary | ICD-10-CM | POA: Insufficient documentation

## 2015-08-09 DIAGNOSIS — Z720 Tobacco use: Secondary | ICD-10-CM | POA: Diagnosis not present

## 2015-08-09 DIAGNOSIS — Z3202 Encounter for pregnancy test, result negative: Secondary | ICD-10-CM | POA: Diagnosis not present

## 2015-08-09 LAB — COMPREHENSIVE METABOLIC PANEL
ALBUMIN: 3.3 g/dL — AB (ref 3.5–5.0)
ALT: 17 U/L (ref 14–54)
ANION GAP: 11 (ref 5–15)
AST: 22 U/L (ref 15–41)
Alkaline Phosphatase: 54 U/L (ref 38–126)
BUN: 8 mg/dL (ref 6–20)
CALCIUM: 9.1 mg/dL (ref 8.9–10.3)
CO2: 23 mmol/L (ref 22–32)
Chloride: 103 mmol/L (ref 101–111)
Creatinine, Ser: 0.7 mg/dL (ref 0.44–1.00)
GFR calc non Af Amer: >60 mL/min (ref >60)
Glucose, Bld: 119 mg/dL — ABNORMAL HIGH (ref 65–99)
Potassium: 4.4 mmol/L (ref 3.5–5.1)
Sodium: 137 mmol/L (ref 135–145)
TOTAL PROTEIN: 6.6 g/dL (ref 6.5–8.1)
Total Bilirubin: 0.2 mg/dL — ABNORMAL LOW (ref 0.3–1.2)

## 2015-08-09 LAB — CBC
HCT: 37.5 % (ref 36.0–46.0)
Hemoglobin: 12.2 g/dL (ref 12.0–15.0)
MCH: 30.7 pg (ref 26.0–34.0)
MCHC: 32.5 g/dL (ref 30.0–36.0)
MCV: 94.5 fL (ref 78.0–100.0)
PLATELETS: 318 10*3/uL (ref 150–400)
RBC: 3.97 MIL/uL (ref 3.87–5.11)
RDW: 13.6 % (ref 11.5–15.5)
WBC: 9.2 10*3/uL (ref 4.0–10.5)

## 2015-08-09 LAB — I-STAT BETA HCG BLOOD, ED (MC, WL, AP ONLY)

## 2015-08-09 LAB — URINE MICROSCOPIC-ADD ON

## 2015-08-09 LAB — URINALYSIS, ROUTINE W REFLEX MICROSCOPIC
Bilirubin Urine: NEGATIVE
GLUCOSE, UA: NEGATIVE mg/dL
Ketones, ur: NEGATIVE mg/dL
Nitrite: NEGATIVE
PH: 6.5 (ref 5.0–8.0)
Protein, ur: NEGATIVE mg/dL
Specific Gravity, Urine: 1.012 (ref 1.005–1.030)
Urobilinogen, UA: 0.2 mg/dL (ref 0.0–1.0)

## 2015-08-09 LAB — LIPASE, BLOOD: LIPASE: 36 U/L (ref 22–51)

## 2015-08-09 MED ORDER — HYDROMORPHONE HCL 1 MG/ML IJ SOLN
1.0000 mg | INTRAMUSCULAR | Status: AC | PRN
  Administered 2015-08-09 (×3): 1 mg via INTRAVENOUS
  Filled 2015-08-09 (×3): qty 1

## 2015-08-09 MED ORDER — KETOROLAC TROMETHAMINE 30 MG/ML IJ SOLN
30.0000 mg | Freq: Once | INTRAMUSCULAR | Status: AC
  Administered 2015-08-09: 30 mg via INTRAVENOUS
  Filled 2015-08-09: qty 1

## 2015-08-09 MED ORDER — HYDROCODONE-ACETAMINOPHEN 5-325 MG PO TABS: 1.0000 | ORAL_TABLET | Freq: Four times a day (QID) | ORAL | Status: DC | PRN

## 2015-08-09 MED ORDER — PANTOPRAZOLE SODIUM 40 MG IV SOLR
40.0000 mg | Freq: Once | INTRAVENOUS | Status: AC
  Administered 2015-08-09: 40 mg via INTRAVENOUS
  Filled 2015-08-09: qty 40

## 2015-08-09 MED ORDER — SODIUM CHLORIDE 0.9 % IV SOLN
1000.0000 mL | Freq: Once | INTRAVENOUS | Status: AC
  Administered 2015-08-09: 1000 mL via INTRAVENOUS

## 2015-08-09 MED ORDER — PANTOPRAZOLE SODIUM 20 MG PO TBEC: 20.0000 mg | DELAYED_RELEASE_TABLET | Freq: Every day | ORAL | Status: DC

## 2015-08-09 MED ORDER — ONDANSETRON HCL 4 MG/2ML IJ SOLN
4.0000 mg | Freq: Once | INTRAMUSCULAR | Status: AC
  Administered 2015-08-09: 4 mg via INTRAVENOUS
  Filled 2015-08-09: qty 2

## 2015-08-09 MED ORDER — SODIUM CHLORIDE 0.9 % IV SOLN
1000.0000 mL | INTRAVENOUS | Status: DC
  Administered 2015-08-09: 1000 mL via INTRAVENOUS

## 2015-08-09 NOTE — ED Notes (Signed)
MD at bedside. 

## 2015-08-09 NOTE — Discharge Instructions (Signed)

## 2015-08-09 NOTE — ED Notes (Signed)
Pt here for RUQ pain sts gallbladder attack. Pt sts here recently for the same. Started 1 hour ago. Pt diaphoretic in pain.

## 2015-08-09 NOTE — ED Provider Notes (Signed)
CSN: [id number]     Arrival date & time 08/09/15  1843 History   First MD Initiated Contact with Patient 08/09/15 1900     Chief Complaint  Patient presents with  . Abdominal Pain   HPI Patient presents to the emergency room with the onset of severe right upper quadrant abdominal pain that started about 2 hours ago suddenly. She had not been eating anything recently. The pain stays in that location. Nothing seems to make it better or worse. Patient was recently seen in the emergency room on July 25 for similar episode. An extensive evaluation including a CT scan of the abdomen and pelvis, right upper quadrant ultrasound, and a HIDA scan. The results of that test did not demonstrate cholecystitis or gallstones. Past Medical History  Diagnosis Date  . Depression   . Thyroid disease   . Family history of breast cancer   . Leg mass     left   Past Surgical History  Procedure Laterality Date  . Fracture surgery  1996    right hand   Family History  Problem Relation Age of Onset  . Cancer Mother     breast and ovarian  . Hypertension Mother   . Diabetes Mother   . Hypertension Father   . Cancer Maternal Grandmother     colon   Social History  Substance Use Topics  . Smoking status: Current Every Day Smoker -- 0.50 packs/day  . Smokeless tobacco: None  . Alcohol Use: 1.2 oz/week    2 Glasses of wine per week     Comment: socially   OB History    No data available     Review of Systems  All other systems reviewed and are negative.     Allergies  Review of patient's allergies indicates no known allergies.  Home Medications   Prior to Admission medications   Medication Sig Start Date End Date Taking? Authorizing Provider  ALPRAZolam (XANAX) 0.25 MG tablet Take 0.25 mg by mouth 2 (two) times daily as needed for anxiety.    Yes Historical Provider, MD  diphenhydramine-acetaminophen (TYLENOL PM) 25-500 MG TABS Take 1 tablet by mouth at bedtime as needed.   Yes Historical  Provider, MD  ibuprofen (ADVIL,MOTRIN) 200 MG tablet Take 400 mg by mouth every 6 (six) hours as needed.   Yes Historical Provider, MD  Alain Honey Triphasic (ENPRESSE-28 PO) Take 1 tablet by mouth daily.   Yes Historical Provider, MD  levothyroxine (SYNTHROID, LEVOTHROID) 75 MCG tablet Take 75 mcg by mouth daily before breakfast.   Yes Historical Provider, MD  venlafaxine XR (EFFEXOR-XR) 150 MG 24 hr capsule Take 150 mg by mouth daily with breakfast.   Yes Historical Provider, MD  HYDROcodone-acetaminophen (NORCO/VICODIN) 5-325 MG per tablet Take 1-2 tablets by mouth every 6 (six) hours as needed. 08/09/15   Linwood Dibbles, MD  ondansetron (ZOFRAN ODT) 4 MG disintegrating tablet 4mg  ODT q4 hours prn nausea/vomit 07/24/15   Richardean Canal, MD  pantoprazole (PROTONIX) 20 MG tablet Take 1 tablet (20 mg total) by mouth daily. 08/09/15   Linwood Dibbles, MD   BP 124/61 mmHg  Pulse 91  Temp(Src) 97.5 F (36.4 C) (Oral)  Resp 14  SpO2 96%  LMP 07/03/2015 (Approximate) Physical Exam  Constitutional: She appears well-developed and well-nourished. No distress.  HENT:  Head: Normocephalic and atraumatic.  Right Ear: External ear normal.  Left Ear: External ear normal.  Eyes: Conjunctivae are normal. Right eye exhibits no discharge. Left eye exhibits  no discharge. No scleral icterus.  Neck: Neck supple. No tracheal deviation present.  Cardiovascular: Normal rate, regular rhythm and intact distal pulses.   Pulmonary/Chest: Effort normal and breath sounds normal. No stridor. No respiratory distress. She has no wheezes. She has no rales.  Abdominal: Soft. Bowel sounds are normal. She exhibits no distension and no mass. There is tenderness in the right upper quadrant. There is guarding. There is no rebound. No hernia.  Musculoskeletal: She exhibits no edema or tenderness.  Neurological: She is alert. She has normal strength. No cranial nerve deficit (no facial droop, extraocular movements intact, no slurred  speech) or sensory deficit. She exhibits normal muscle tone. She displays no seizure activity. Coordination normal.  Skin: Skin is warm and dry. No rash noted.  Psychiatric: She has a normal mood and affect.  Nursing note and vitals reviewed.   ED Course  Procedures (including critical care time) Labs Review Labs Reviewed  COMPREHENSIVE METABOLIC PANEL - Abnormal; Notable for the following:    Glucose, Bld 119 (*)    Albumin 3.3 (*)    Total Bilirubin 0.2 (*)    All other components within normal limits  URINALYSIS, ROUTINE W REFLEX MICROSCOPIC (NOT AT [facility]) - Abnormal; Notable for the following:    APPearance CLOUDY (*)    Hgb urine dipstick TRACE (*)    Leukocytes, UA TRACE (*)    All other components within normal limits  URINE MICROSCOPIC-ADD ON - Abnormal; Notable for the following:    Squamous Epithelial / LPF MANY (*)    All other components within normal limits  LIPASE, BLOOD  CBC  I-STAT BETA HCG BLOOD, ED ([facility], [facility], [facility] ONLY)  I-STAT BETA HCG BLOOD, ED ([facility], [facility], [facility] ONLY)      EKG Interpretation   Date/Time:  Wednesday August 09 2015 19:44:44 EDT Ventricular Rate:  83 PR Interval:  119 QRS Duration: 80 QT Interval:  367 QTC Calculation: 431 R Axis:   39 Text Interpretation:  Sinus rhythm Borderline short PR interval Baseline  wander in lead(s) II III aVR aVF No old tracing to compare Confirmed by  Henya Aguallo  MD-J, Arriyana Rodell ([id number]) on 08/09/2015 8:24:19 PM     Medications  0.9 %  sodium chloride infusion (0 mLs Intravenous Stopped 08/09/15 2210)    Followed by  0.9 %  sodium chloride infusion (1,000 mLs Intravenous New Bag/Given 08/09/15 2204)  ondansetron (ZOFRAN) injection 4 mg (4 mg Intravenous Given 08/09/15 1935)  HYDROmorphone (DILAUDID) injection 1 mg (1 mg Intravenous Given 08/09/15 2204)  ketorolac (TORADOL) 30 MG/ML injection 30 mg (30 mg Intravenous Given 08/09/15 2047)  pantoprazole (PROTONIX) injection 40 mg (40 mg Intravenous Given 08/09/15 2241)    MDM    Final diagnoses:  Right upper quadrant pain    Reviewed the imaging tests from the patient's visit on July 25. Extensive imaging tests that included right upper quadrant ultrasound, and abdominal pelvic CT, and a HIDA scan.  Patient's laboratory tests are reassuring today. Her pain improved after treatment. I do not feel that repeat imaging is necessary at this point. The etiology of her pain is unclear. I will start her on antiacids and have her follow-up with a gastroenterologist.  Discussed plan with patient and her husband.    At this time there does not appear to be any evidence of an acute emergency medical condition and the patient appears stable for discharge with appropriate outpatient follow up.     Linwood Dibbles, MD 08/09/15 2250

## 2015-08-24 ENCOUNTER — Other Ambulatory Visit (HOSPITAL_COMMUNITY): Payer: Self-pay | Admitting: Gastroenterology

## 2015-08-24 DIAGNOSIS — R1011 Right upper quadrant pain: Secondary | ICD-10-CM

## 2015-09-04 ENCOUNTER — Observation Stay (HOSPITAL_COMMUNITY)
Admission: EM | Admit: 2015-09-04 | Discharge: 2015-09-09 | Disposition: A | Discharge status: Home / Self Care | Payer: 59 | Attending: Internal Medicine | Admitting: Internal Medicine

## 2015-09-04 ENCOUNTER — Encounter (HOSPITAL_COMMUNITY): Payer: Self-pay | Admitting: Emergency Medicine

## 2015-09-04 DIAGNOSIS — F411 Generalized anxiety disorder: Secondary | ICD-10-CM

## 2015-09-04 DIAGNOSIS — R112 Nausea with vomiting, unspecified: Secondary | ICD-10-CM | POA: Diagnosis present

## 2015-09-04 DIAGNOSIS — F329 Major depressive disorder, single episode, unspecified: Secondary | ICD-10-CM | POA: Insufficient documentation

## 2015-09-04 DIAGNOSIS — K8010 Calculus of gallbladder with chronic cholecystitis without obstruction: Principal | ICD-10-CM | POA: Insufficient documentation

## 2015-09-04 DIAGNOSIS — K449 Diaphragmatic hernia without obstruction or gangrene: Secondary | ICD-10-CM | POA: Diagnosis not present

## 2015-09-04 DIAGNOSIS — R109 Unspecified abdominal pain: Secondary | ICD-10-CM

## 2015-09-04 DIAGNOSIS — Z803 Family history of malignant neoplasm of breast: Secondary | ICD-10-CM | POA: Insufficient documentation

## 2015-09-04 DIAGNOSIS — F17210 Nicotine dependence, cigarettes, uncomplicated: Secondary | ICD-10-CM | POA: Insufficient documentation

## 2015-09-04 DIAGNOSIS — K828 Other specified diseases of gallbladder: Secondary | ICD-10-CM | POA: Insufficient documentation

## 2015-09-04 DIAGNOSIS — F419 Anxiety disorder, unspecified: Secondary | ICD-10-CM | POA: Diagnosis not present

## 2015-09-04 DIAGNOSIS — E039 Hypothyroidism, unspecified: Secondary | ICD-10-CM | POA: Insufficient documentation

## 2015-09-04 DIAGNOSIS — R030 Elevated blood-pressure reading, without diagnosis of hypertension: Secondary | ICD-10-CM

## 2015-09-04 DIAGNOSIS — IMO0001 Reserved for inherently not codable concepts without codable children: Secondary | ICD-10-CM

## 2015-09-04 DIAGNOSIS — R1084 Generalized abdominal pain: Secondary | ICD-10-CM

## 2015-09-04 NOTE — ED Notes (Signed)
Pt. reports upper abdominal pain with emesis this evening , denies diarrhea / no fever or chills.

## 2015-09-05 ENCOUNTER — Observation Stay (HOSPITAL_COMMUNITY): Payer: 59

## 2015-09-05 ENCOUNTER — Encounter (HOSPITAL_COMMUNITY): Payer: Self-pay

## 2015-09-05 ENCOUNTER — Emergency Department (HOSPITAL_COMMUNITY): Payer: 59

## 2015-09-05 DIAGNOSIS — E039 Hypothyroidism, unspecified: Secondary | ICD-10-CM | POA: Diagnosis not present

## 2015-09-05 DIAGNOSIS — R109 Unspecified abdominal pain: Secondary | ICD-10-CM

## 2015-09-05 LAB — URINALYSIS, ROUTINE W REFLEX MICROSCOPIC
Glucose, UA: NEGATIVE mg/dL
Hgb urine dipstick: NEGATIVE
Ketones, ur: 15 mg/dL — AB
LEUKOCYTES UA: NEGATIVE
NITRITE: NEGATIVE
PROTEIN: NEGATIVE mg/dL
Specific Gravity, Urine: 1.025 (ref 1.005–1.030)
UROBILINOGEN UA: 1 mg/dL (ref 0.0–1.0)
pH: 6 (ref 5.0–8.0)

## 2015-09-05 LAB — COMPREHENSIVE METABOLIC PANEL
ALBUMIN: 3.2 g/dL — AB (ref 3.5–5.0)
ALK PHOS: 57 U/L (ref 38–126)
ALT: 14 U/L (ref 14–54)
AST: 20 U/L (ref 15–41)
Anion gap: 9 (ref 5–15)
BILIRUBIN TOTAL: 0.4 mg/dL (ref 0.3–1.2)
BUN: 11 mg/dL (ref 6–20)
CALCIUM: 8.7 mg/dL — AB (ref 8.9–10.3)
CO2: 26 mmol/L (ref 22–32)
Chloride: 102 mmol/L (ref 101–111)
Creatinine, Ser: 0.78 mg/dL (ref 0.44–1.00)
GFR calc non Af Amer: >60 mL/min (ref >60)
GLUCOSE: 126 mg/dL — AB (ref 65–99)
POTASSIUM: 3.8 mmol/L (ref 3.5–5.1)
SODIUM: 137 mmol/L (ref 135–145)
TOTAL PROTEIN: 6.1 g/dL — AB (ref 6.5–8.1)

## 2015-09-05 LAB — HEPATIC FUNCTION PANEL
ALK PHOS: 48 U/L (ref 38–126)
ALT: 14 U/L (ref 14–54)
AST: 17 U/L (ref 15–41)
Albumin: 2.9 g/dL — ABNORMAL LOW (ref 3.5–5.0)
BILIRUBIN TOTAL: 0.2 mg/dL — AB (ref 0.3–1.2)
TOTAL PROTEIN: 6 g/dL — AB (ref 6.5–8.1)

## 2015-09-05 LAB — BASIC METABOLIC PANEL
Anion gap: 8 (ref 5–15)
BUN: 8 mg/dL (ref 6–20)
CO2: 27 mmol/L (ref 22–32)
CREATININE: 0.79 mg/dL (ref 0.44–1.00)
Calcium: 8.4 mg/dL — ABNORMAL LOW (ref 8.9–10.3)
Chloride: 104 mmol/L (ref 101–111)
GFR calc Af Amer: >60 mL/min (ref >60)
GLUCOSE: 106 mg/dL — AB (ref 65–99)
POTASSIUM: 3.9 mmol/L (ref 3.5–5.1)
Sodium: 139 mmol/L (ref 135–145)

## 2015-09-05 LAB — CBC WITH DIFFERENTIAL/PLATELET
BASOS ABS: 0 10*3/uL (ref 0.0–0.1)
Basophils Relative: 0 % (ref 0–1)
EOS ABS: 0.1 10*3/uL (ref 0.0–0.7)
EOS PCT: 1 % (ref 0–5)
HCT: 35.2 % — ABNORMAL LOW (ref 36.0–46.0)
Hemoglobin: 11.1 g/dL — ABNORMAL LOW (ref 12.0–15.0)
LYMPHS PCT: 27 % (ref 12–46)
Lymphs Abs: 2.6 10*3/uL (ref 0.7–4.0)
MCH: 30.3 pg (ref 26.0–34.0)
MCHC: 31.5 g/dL (ref 30.0–36.0)
MCV: 96.2 fL (ref 78.0–100.0)
MONO ABS: 0.3 10*3/uL (ref 0.1–1.0)
Monocytes Relative: 3 % (ref 3–12)
Neutro Abs: 6.5 10*3/uL (ref 1.7–7.7)
Neutrophils Relative %: 69 % (ref 43–77)
PLATELETS: 298 10*3/uL (ref 150–400)
RBC: 3.66 MIL/uL — ABNORMAL LOW (ref 3.87–5.11)
RDW: 14 % (ref 11.5–15.5)
WBC: 9.4 10*3/uL (ref 4.0–10.5)

## 2015-09-05 LAB — CBC
HEMATOCRIT: 37.8 % (ref 36.0–46.0)
Hemoglobin: 12.4 g/dL (ref 12.0–15.0)
MCH: 31.6 pg (ref 26.0–34.0)
MCHC: 32.8 g/dL (ref 30.0–36.0)
MCV: 96.4 fL (ref 78.0–100.0)
Platelets: 353 10*3/uL (ref 150–400)
RBC: 3.92 MIL/uL (ref 3.87–5.11)
RDW: 13.9 % (ref 11.5–15.5)
WBC: 8.3 10*3/uL (ref 4.0–10.5)

## 2015-09-05 LAB — POC URINE PREG, ED: PREG TEST UR: NEGATIVE

## 2015-09-05 LAB — POCT I-STAT TROPONIN I: TROPONIN I, POC: 0 ng/mL (ref 0.00–0.08)

## 2015-09-05 LAB — LIPASE, BLOOD
Lipase: 48 U/L (ref 22–51)
Lipase: 23 U/L (ref 22–51)

## 2015-09-05 MED ORDER — GI COCKTAIL ~~LOC~~
30.0000 mL | Freq: Once | ORAL | Status: AC
  Administered 2015-09-05: 30 mL via ORAL
  Filled 2015-09-05: qty 30

## 2015-09-05 MED ORDER — CETYLPYRIDINIUM CHLORIDE 0.05 % MT LIQD
7.0000 mL | Freq: Two times a day (BID) | OROMUCOSAL | Status: DC
  Administered 2015-09-05 – 2015-09-06 (×3): 7 mL via OROMUCOSAL

## 2015-09-05 MED ORDER — ONDANSETRON HCL 4 MG/2ML IJ SOLN
4.0000 mg | Freq: Once | INTRAMUSCULAR | Status: AC
  Administered 2015-09-05: 4 mg via INTRAVENOUS
  Filled 2015-09-05: qty 2

## 2015-09-05 MED ORDER — HYDROMORPHONE HCL 1 MG/ML IJ SOLN
1.0000 mg | Freq: Once | INTRAMUSCULAR | Status: AC
  Administered 2015-09-05: 1 mg via INTRAVENOUS
  Filled 2015-09-05: qty 1

## 2015-09-05 MED ORDER — SODIUM CHLORIDE 0.9 % IV SOLN: INTRAVENOUS | Status: DC

## 2015-09-05 MED ORDER — LEVOTHYROXINE SODIUM 50 MCG PO TABS
75.0000 ug | ORAL_TABLET | Freq: Every day | ORAL | Status: DC
  Administered 2015-09-05 – 2015-09-09 (×5): 75 ug via ORAL
  Filled 2015-09-05 (×10): qty 1

## 2015-09-05 MED ORDER — PANTOPRAZOLE SODIUM 40 MG IV SOLR
40.0000 mg | Freq: Two times a day (BID) | INTRAVENOUS | Status: DC
  Administered 2015-09-05 – 2015-09-09 (×9): 40 mg via INTRAVENOUS
  Filled 2015-09-05 (×9): qty 40

## 2015-09-05 MED ORDER — ACETAMINOPHEN 650 MG RE SUPP: 650.0000 mg | Freq: Four times a day (QID) | RECTAL | Status: DC | PRN

## 2015-09-05 MED ORDER — ONDANSETRON HCL 4 MG/2ML IJ SOLN: 4.0000 mg | Freq: Four times a day (QID) | INTRAMUSCULAR | Status: DC | PRN

## 2015-09-05 MED ORDER — HYDROMORPHONE HCL 1 MG/ML IJ SOLN
1.0000 mg | INTRAMUSCULAR | Status: DC | PRN
  Administered 2015-09-05: 1 mg via INTRAVENOUS
  Filled 2015-09-05: qty 1

## 2015-09-05 MED ORDER — PANTOPRAZOLE SODIUM 40 MG PO TBEC
40.0000 mg | DELAYED_RELEASE_TABLET | Freq: Once | ORAL | Status: AC
  Administered 2015-09-05: 40 mg via ORAL
  Filled 2015-09-05: qty 1

## 2015-09-05 MED ORDER — ACETAMINOPHEN 325 MG PO TABS
650.0000 mg | ORAL_TABLET | Freq: Four times a day (QID) | ORAL | Status: DC | PRN
  Administered 2015-09-05: 650 mg via ORAL
  Filled 2015-09-05: qty 2

## 2015-09-05 MED ORDER — VENLAFAXINE HCL ER 75 MG PO CP24
150.0000 mg | ORAL_CAPSULE | Freq: Every day | ORAL | Status: DC
  Administered 2015-09-05 – 2015-09-09 (×5): 150 mg via ORAL
  Filled 2015-09-05 (×5): qty 2

## 2015-09-05 MED ORDER — ALPRAZOLAM 0.25 MG PO TABS
0.2500 mg | ORAL_TABLET | Freq: Two times a day (BID) | ORAL | Status: DC | PRN
  Administered 2015-09-05 – 2015-09-09 (×4): 0.25 mg via ORAL
  Filled 2015-09-05 (×4): qty 1

## 2015-09-05 MED ORDER — CHLORHEXIDINE GLUCONATE 0.12 % MT SOLN
15.0000 mL | Freq: Two times a day (BID) | OROMUCOSAL | Status: DC
  Administered 2015-09-05 – 2015-09-08 (×7): 15 mL via OROMUCOSAL
  Filled 2015-09-05 (×7): qty 15

## 2015-09-05 MED ORDER — HYDROMORPHONE HCL 1 MG/ML IJ SOLN
1.0000 mg | INTRAMUSCULAR | Status: AC | PRN
  Administered 2015-09-05 – 2015-09-06 (×7): 1 mg via INTRAVENOUS
  Filled 2015-09-05 (×7): qty 1

## 2015-09-05 MED ORDER — ONDANSETRON HCL 4 MG PO TABS: 4.0000 mg | ORAL_TABLET | Freq: Four times a day (QID) | ORAL | Status: DC | PRN

## 2015-09-05 MED ORDER — SODIUM CHLORIDE 0.9 % IV SOLN
INTRAVENOUS | Status: DC
  Administered 2015-09-05: 04:00:00 via INTRAVENOUS

## 2015-09-05 MED ORDER — SODIUM CHLORIDE 0.9 % IV SOLN
INTRAVENOUS | Status: AC
  Administered 2015-09-05 (×2): via INTRAVENOUS

## 2015-09-05 MED ORDER — HYDROMORPHONE HCL 1 MG/ML IJ SOLN
1.0000 mg | INTRAMUSCULAR | Status: DC | PRN
  Administered 2015-09-05 (×3): 1 mg via INTRAVENOUS
  Filled 2015-09-05 (×3): qty 1

## 2015-09-05 NOTE — Progress Notes (Signed)
Patient seen and evaluated earlier this a.m. by my associate. Please refer to H&P for details regarding assessment and plan.  GI consulted and currently on board and will plan on reevaluating later on today. I will defer pain management recommendations to them.  Gen.: Patient in no acute distress alert and awake Cardiovascular: No cyanosis Pulmonary: Equal chest rise, no wheezes Abdomen: No guarding  We'll plan on reassessing next a.m. or sooner should acute medical condition needing reevaluation arise.  Shawnese Magner, [employer]

## 2015-09-05 NOTE — H&P (Signed)
[facility] History and Physical  LEI DOWER ZHY:[id number] DOB: November 05, 1976 DOA: 09/04/2015  Referring physician: Dr. Elesa Massed. PCP: REDMON,NOELLE, PA-C  Specialists: Dr. Evette Cristal. Gastroenterologist.  Chief Complaint: Abdominal pain and nausea vomiting.  HPI: Emma [name] is a 39 y.o. female with history of hypothyroidism and anxiety and depression presents to the ER because of abdominal pain and nausea vomiting. Patient has been having these symptoms since last evening. Patient states the pain is mostly in the right upper quadrant epigastric area. Patient has had multiple episodes of nausea and vomiting. Patient was given at least a dose of Dilaudid for pain relief. Patient has no symptoms on August 10 and July 25 of this year. CT scan of the abdomen done at that time in July was showing pericholecystic fluid which is followed by a normal HIDA scan. At this time abdominal sonogram in the ER does not show anything acute. Labs are also not showing anything acute. Patient had followed up with Dr. Evette Cristal, gastroenterologist for these complaints recently. On-call gastroenterologist Dr. Bosie Clos was consulted by the ER physician and they'll be seeing patient in consult. Patient has been admitted for further observation. Denies any diarrhea fever chills.  Review of Systems: As presented in the history of presenting illness, rest negative.  Past Medical History  Diagnosis Date  . Depression   . Thyroid disease   . Family history of breast cancer   . Leg mass     left   Past Surgical History  Procedure Laterality Date  . Fracture surgery  1996    right hand   Social History:  reports that she has been smoking.  She does not have any smokeless tobacco history on file. She reports that she drinks about 1.2 oz of alcohol per week. Her drug history is not on file. Where does patient live home. Can patient participate in ADLs? Yes.  No Known Allergies  Family History:  Family History   Problem Relation Age of Onset  . Cancer Mother     breast and ovarian  . Hypertension Mother   . Diabetes Mother   . Hypertension Father   . Cancer Maternal Grandmother     colon      Prior to Admission medications   Medication Sig Start Date End Date Taking? Authorizing Provider  ALPRAZolam (XANAX) 0.25 MG tablet Take 0.25 mg by mouth 2 (two) times daily as needed for anxiety.    Yes Historical Provider, MD  diphenhydramine-acetaminophen (TYLENOL PM) 25-500 MG TABS Take 1 tablet by mouth at bedtime as needed.   Yes Historical Provider, MD  ibuprofen (ADVIL,MOTRIN) 200 MG tablet Take 400 mg by mouth every 6 (six) hours as needed for moderate pain.    Yes Historical Provider, MD  Alain Honey Triphasic (ENPRESSE-28 PO) Take 1 tablet by mouth daily.   Yes Historical Provider, MD  levothyroxine (SYNTHROID, LEVOTHROID) 75 MCG tablet Take 75 mcg by mouth daily before breakfast.   Yes Historical Provider, MD  venlafaxine XR (EFFEXOR-XR) 150 MG 24 hr capsule Take 150 mg by mouth daily with breakfast.   Yes Historical Provider, MD    Physical Exam: Filed Vitals:   09/05/15 0330 09/05/15 0345 09/05/15 0400 09/05/15 0453  BP: 117/66 124/71 110/51 105/58  Pulse: 81 92 100 86  Temp:    98.4 F (36.9 C)  TempSrc:    Oral  Resp: 14 11 24 20   Height:    5\' 2"  (1.575 m)  Weight:    113.4 kg (250  lb)  SpO2:  95% 96% 95%     General:  Moderately built and nourished.  Eyes: Anicteric no pallor.  ENT: No discharge from the ears eyes nose and mouth.  Neck: No mass felt.  Cardiovascular: S1 and S2 heard.  Respiratory: No rhonchi or crepitations.  Abdomen: Soft nontender bowel sounds present. Patient has just received pain medications.  Skin: No rash.  Musculoskeletal: No edema.  Psychiatric: Appears normal.  Neurologic: Alert awake oriented to time place and person. Moves all extremities.  Labs on Admission:  Basic Metabolic Panel:  Recent Labs Lab 09/05/15  NA 137   K 3.8  CL 102  CO2 26  GLUCOSE 126*  BUN 11  CREATININE 0.78  CALCIUM 8.7*   Liver Function Tests:  Recent Labs Lab 09/05/15  AST 20  ALT 14  ALKPHOS 57  BILITOT 0.4  PROT 6.1*  ALBUMIN 3.2*    Recent Labs Lab 09/05/15  LIPASE 48   No results for input(s): AMMONIA in the last 168 hours. CBC:  Recent Labs Lab 09/05/15  WBC 8.3  HGB 12.4  HCT 37.8  MCV 96.4  PLT 353   Cardiac Enzymes: No results for input(s): CKTOTAL, CKMB, CKMBINDEX, TROPONINI in the last 168 hours.  BNP (last 3 results) No results for input(s): BNP in the last 8760 hours.  ProBNP (last 3 results) No results for input(s): PROBNP in the last 8760 hours.  CBG: No results for input(s): GLUCAP in the last 168 hours.  Radiological Exams on Admission: Dg Abd Acute W/chest  09/05/2015   CLINICAL DATA:  Right upper quadrant pain on off for 2 months but worse tonight.  EXAM: DG ABDOMEN ACUTE W/ 1V CHEST  COMPARISON:  CT abdomen and pelvis 07/24/2015  FINDINGS: Normal heart size and pulmonary vascularity. No focal airspace disease or consolidation in the lungs. No blunting of costophrenic angles. No pneumothorax. Mediastinal contours appear intact.  Scattered gas and stool in the colon. No small or large bowel distention. No free intra-abdominal air. No abnormal air-fluid levels. No radiopaque stones. Visualized bones appear intact.  IMPRESSION: No evidence of active pulmonary disease. Nonobstructive bowel gas pattern.   Electronically Signed   By: Burman Nieves M.D.   On: 09/05/2015 04:28   US Abdomen Limited Ruq  09/05/2015   CLINICAL DATA:  39 year old female with right upper quadrant abdominal pain.  EXAM: US ABDOMEN LIMITED - RIGHT UPPER QUADRANT  COMPARISON:  CT, ultrasound, and Hepatobiliary scintigraphy dated 07/24/2015  FINDINGS: Gallbladder:  No gallstones or wall thickening visualized. No sonographic Murphy sign noted.  Common bile duct:  Diameter: 4 mm  Liver:  There is mild diffuse increase in  hepatic echogenicity compatible with fatty infiltration.  IMPRESSION: No gallstone.   Electronically Signed   By: Elgie Collard M.D.   On: 09/05/2015 02:21    Assessment/Plan Active Problems:   Abdominal pain   Hypothyroidism   1. Abdominal pain with nausea and vomiting - cause not clear. At this time Dr. Bosie Clos was consulted by the ER physician and patient will be kept nothing by mouth in anticipation of possible EGD. I have added Protonix. Check urine drug screen. Pain relief medications. 2. Hypothyroidism on Synthroid. 3. Anxiety and depression continue present medications.  I have reviewed patient's old charts are labs.   DVT Prophylaxis SCDs.  Code Status: Full code.  Family Communication: Discussed with patient.  Disposition Plan: Admit for observation.    Krislyn Donnan N. [facility] Pager [phone].  If 7PM-7AM, please  contact night-coverage [email or web address] Password [email or web address] 09/05/2015, 5:42 AM

## 2015-09-05 NOTE — Progress Notes (Signed)
We will see later today but unfortunately cannot see this morning and will allow clear liquids and consider CCK PIPIDA scan versus MRCP versus endoscopy tomorrow to continue her workup and her office computer chart and the hospital computer chart was reviewed

## 2015-09-05 NOTE — Consult Note (Signed)
Reason for Consult: Abdominal pain Referring Physician: Hospital team  Emma [name] is an 39 y.o. female.  HPI: Patient seen and examined and her case discussed with her husband as well and her office computer chart was reviewed and her hospital computer chart was reviewed and for the last month or 2 she's had episodic right upper quadrant pain with occasional nausea vomiting but no change in the lower bowel habits and only occasional fever and it can last a day or 2 and she cannot say what brings it on although it does not wake her up and she cannot say what makes it go away however the pain medicine helps and she's never had a GI problem before any previous workup and her current CT ultrasound and initial HIDA scan and lab work was all reviewed and she has no new complaints and she's fine in between these spells without any GI complaint specifically no reflux heartburn etc.  Past Medical History  Diagnosis Date  . Depression   . Thyroid disease   . Family history of breast cancer   . Leg mass     left    Past Surgical History  Procedure Laterality Date  . Fracture surgery  1996    right hand    Family History  Problem Relation Age of Onset  . Cancer Mother     breast and ovarian  . Hypertension Mother   . Diabetes Mother   . Hypertension Father   . Cancer Maternal Grandmother     colon    Social History:  reports that she has been smoking.  She does not have any smokeless tobacco history on file. She reports that she drinks about 1.2 oz of alcohol per week. Her drug history is not on file.  Allergies: No Known Allergies  Medications: I have reviewed the patient's current medications.  Results for orders placed or performed during the hospital encounter of 09/04/15 (from the past 48 hour(s))  Lipase, blood     Status: None   Collection Time: 09/05/15 12:00 AM  Result Value Ref Range   Lipase 48 22 - 51 U/L  Comprehensive metabolic panel     Status: Abnormal   Collection Time: 09/05/15 12:00 AM  Result Value Ref Range   Sodium 137 135 - 145 mmol/L   Potassium 3.8 3.5 - 5.1 mmol/L   Chloride 102 101 - 111 mmol/L   CO2 26 22 - 32 mmol/L   Glucose, Bld 126 (H) 65 - 99 mg/dL   BUN 11 6 - 20 mg/dL   Creatinine, Ser 0.78 0.44 - 1.00 mg/dL   Calcium 8.7 (L) 8.9 - 10.3 mg/dL   Total Protein 6.1 (L) 6.5 - 8.1 g/dL   Albumin 3.2 (L) 3.5 - 5.0 g/dL   AST 20 15 - 41 U/L   ALT 14 14 - 54 U/L   Alkaline Phosphatase 57 38 - 126 U/L   Total Bilirubin 0.4 0.3 - 1.2 mg/dL   GFR calc non Af Amer >60 >60 mL/min   GFR calc Af Amer >60 >60 mL/min    Comment: (NOTE) The eGFR has been calculated using the CKD EPI equation. This calculation has not been validated in all clinical situations. eGFR's persistently <60 mL/min signify possible Chronic Kidney Disease.    Anion gap 9 5 - 15  CBC     Status: None   Collection Time: 09/05/15 12:00 AM  Result Value Ref Range   WBC 8.3 4.0 - 10.5 K/uL  RBC 3.92 3.87 - 5.11 MIL/uL   Hemoglobin 12.4 12.0 - 15.0 g/dL   HCT 37.8 36.0 - 46.0 %   MCV 96.4 78.0 - 100.0 fL   MCH 31.6 26.0 - 34.0 pg   MCHC 32.8 30.0 - 36.0 g/dL   RDW 13.9 11.5 - 15.5 %   Platelets 353 150 - 400 K/uL  POCT i-Stat troponin I     Status: None   Collection Time: 09/05/15 12:19 AM  Result Value Ref Range   Troponin i, poc 0.00 0.00 - 0.08 ng/mL   Comment 3            Comment: Due to the release kinetics of cTnI, a negative result within the first hours of the onset of symptoms does not rule out myocardial infarction with certainty. If myocardial infarction is still suspected, repeat the test at appropriate intervals.   Urinalysis, Routine w reflex microscopic (not at [facility])     Status: Abnormal   Collection Time: 09/05/15  1:01 AM  Result Value Ref Range   Color, Urine YELLOW YELLOW   APPearance CLOUDY (A) CLEAR   Specific Gravity, Urine 1.025 1.005 - 1.030   pH 6.0 5.0 - 8.0   Glucose, UA NEGATIVE NEGATIVE mg/dL   Hgb urine  dipstick NEGATIVE NEGATIVE   Bilirubin Urine SMALL (A) NEGATIVE   Ketones, ur 15 (A) NEGATIVE mg/dL   Protein, ur NEGATIVE NEGATIVE mg/dL   Urobilinogen, UA 1.0 0.0 - 1.0 mg/dL   Nitrite NEGATIVE NEGATIVE   Leukocytes, UA NEGATIVE NEGATIVE    Comment: MICROSCOPIC NOT DONE ON URINES WITH NEGATIVE PROTEIN, BLOOD, LEUKOCYTES, NITRITE, OR GLUCOSE <1000 mg/dL.  POC urine preg, ED (not at [facility])     Status: None   Collection Time: 09/05/15  1:05 AM  Result Value Ref Range   Preg Test, Ur NEGATIVE NEGATIVE    Comment:        THE SENSITIVITY OF THIS METHODOLOGY IS >24 mIU/mL   Lipase, blood     Status: None   Collection Time: 09/05/15  6:09 AM  Result Value Ref Range   Lipase 23 22 - 51 U/L  Hepatic function panel     Status: Abnormal   Collection Time: 09/05/15  6:09 AM  Result Value Ref Range   Total Protein 6.0 (L) 6.5 - 8.1 g/dL   Albumin 2.9 (L) 3.5 - 5.0 g/dL   AST 17 15 - 41 U/L   ALT 14 14 - 54 U/L   Alkaline Phosphatase 48 38 - 126 U/L   Total Bilirubin 0.2 (L) 0.3 - 1.2 mg/dL   Bilirubin, Direct <0.1 (L) 0.1 - 0.5 mg/dL   Indirect Bilirubin NOT CALCULATED 0.3 - 0.9 mg/dL  Basic metabolic panel     Status: Abnormal   Collection Time: 09/05/15  6:09 AM  Result Value Ref Range   Sodium 139 135 - 145 mmol/L   Potassium 3.9 3.5 - 5.1 mmol/L   Chloride 104 101 - 111 mmol/L   CO2 27 22 - 32 mmol/L   Glucose, Bld 106 (H) 65 - 99 mg/dL   BUN 8 6 - 20 mg/dL   Creatinine, Ser 0.79 0.44 - 1.00 mg/dL   Calcium 8.4 (L) 8.9 - 10.3 mg/dL   GFR calc non Af Amer >60 >60 mL/min   GFR calc Af Amer >60 >60 mL/min    Comment: (NOTE) The eGFR has been calculated using the CKD EPI equation. This calculation has not been validated in all clinical situations. eGFR's  persistently <60 mL/min signify possible Chronic Kidney Disease.    Anion gap 8 5 - 15  CBC WITH DIFFERENTIAL     Status: Abnormal   Collection Time: 09/05/15  6:09 AM  Result Value Ref Range   WBC 9.4 4.0 - 10.5 K/uL   RBC  3.66 (L) 3.87 - 5.11 MIL/uL   Hemoglobin 11.1 (L) 12.0 - 15.0 g/dL   HCT 35.2 (L) 36.0 - 46.0 %   MCV 96.2 78.0 - 100.0 fL   MCH 30.3 26.0 - 34.0 pg   MCHC 31.5 30.0 - 36.0 g/dL   RDW 14.0 11.5 - 15.5 %   Platelets 298 150 - 400 K/uL   Neutrophils Relative % 69 43 - 77 %   Neutro Abs 6.5 1.7 - 7.7 K/uL   Lymphocytes Relative 27 12 - 46 %   Lymphs Abs 2.6 0.7 - 4.0 K/uL   Monocytes Relative 3 3 - 12 %   Monocytes Absolute 0.3 0.1 - 1.0 K/uL   Eosinophils Relative 1 0 - 5 %   Eosinophils Absolute 0.1 0.0 - 0.7 K/uL   Basophils Relative 0 0 - 1 %   Basophils Absolute 0.0 0.0 - 0.1 K/uL    Dg Abd Acute W/chest  09/05/2015   CLINICAL DATA:  Right upper quadrant pain on off for 2 months but worse tonight.  EXAM: DG ABDOMEN ACUTE W/ 1V CHEST  COMPARISON:  CT abdomen and pelvis 07/24/2015  FINDINGS: Normal heart size and pulmonary vascularity. No focal airspace disease or consolidation in the lungs. No blunting of costophrenic angles. No pneumothorax. Mediastinal contours appear intact.  Scattered gas and stool in the colon. No small or large bowel distention. No free intra-abdominal air. No abnormal air-fluid levels. No radiopaque stones. Visualized bones appear intact.  IMPRESSION: No evidence of active pulmonary disease. Nonobstructive bowel gas pattern.   Electronically Signed   By: Lucienne Capers M.D.   On: 09/05/2015 04:28   US Abdomen Limited Ruq  09/05/2015   CLINICAL DATA:  39 year old female with right upper quadrant abdominal pain.  EXAM: US ABDOMEN LIMITED - RIGHT UPPER QUADRANT  COMPARISON:  CT, ultrasound, and Hepatobiliary scintigraphy dated 07/24/2015  FINDINGS: Gallbladder:  No gallstones or wall thickening visualized. No sonographic Murphy sign noted.  Common bile duct:  Diameter: 4 mm  Liver:  There is mild diffuse increase in hepatic echogenicity compatible with fatty infiltration.  IMPRESSION: No gallstone.   Electronically Signed   By: Anner Crete M.D.   On: 09/05/2015  02:21    ROS negative except above and her dad did have his gallbladder out but no other GI problems run in the family Blood pressure 129/74, pulse 72, temperature 97.7 F (36.5 C), temperature source Oral, resp. rate 20, height 5' 2"  (1.575 m), weight 113.4 kg (250 lb), last menstrual period 08/21/2015, SpO2 95 %. Physical Exam vital signs stable afebrile no acute distress lying comfortably in the bed lungs clear heart regular rate and rhythm she has some very mild right upper quadrant pain but nontender elsewhere good bowel sounds soft no pedal edema labs and x-rays reviewed  Assessment/Plan: Right upper quadrant abdominal pain questionable etiology Plan: The risks benefits methods of endoscopy was discussed and will proceed tomorrow hopefully morning and she does have an outpatient CCK HIDA scan scheduled on Friday but if no better we can move that up to Thursday and if nonrevealing consider MRCP next to rule out CBD stones and all of which could be done as  an outpatient if she improves  Srijan Givan E 09/05/2015, 6:44 PM

## 2015-09-05 NOTE — ED Provider Notes (Signed)
TIME SEEN: 12:11 AM   CHIEF COMPLAINT: abdominal pain  HPI: HPI Comments: Emma [name] is a 39 y.o. female who presents to the Emergency Department complaining of RUQ and epigastric abdominal pain onset 1 hour ago. She reports associated nausea and vomiting. She has not noted any exacerbating factors. She states she has eaten a cheeseburger today. She has taken tylenol for pain with mild relief. She states she drinks a few shots and a beer maybe 3 days a week. Denies alcohol today. Denies bloody stool or melena. No chest pain or shortness of breath. She denies any previous abdominal surgery. She notes that she has had similar pains before and was referred to a gastroenterologist, who performed various tests and imaging studies with no conclusive results. Pt denies diarrhea.  ROS: See HPI Constitutional: no fever  Eyes: no drainage  ENT: no runny nose   Cardiovascular:  no chest pain  Resp: no SOB  GI: vomiting GU: no dysuria Integumentary: no rash  Allergy: no hives  Musculoskeletal: no leg swelling  Neurological: no slurred speech ROS otherwise negative  PAST MEDICAL HISTORY/PAST SURGICAL HISTORY:  Past Medical History  Diagnosis Date  . Depression   . Thyroid disease   . Family history of breast cancer   . Leg mass     left    MEDICATIONS:  Prior to Admission medications   Medication Sig Start Date End Date Taking? Authorizing Provider  ALPRAZolam (XANAX) 0.25 MG tablet Take 0.25 mg by mouth 2 (two) times daily as needed for anxiety.     Historical Provider, MD  diphenhydramine-acetaminophen (TYLENOL PM) 25-500 MG TABS Take 1 tablet by mouth at bedtime as needed.    Historical Provider, MD  HYDROcodone-acetaminophen (NORCO/VICODIN) 5-325 MG per tablet Take 1-2 tablets by mouth every 6 (six) hours as needed. 08/09/15   Linwood Dibbles, MD  ibuprofen (ADVIL,MOTRIN) 200 MG tablet Take 400 mg by mouth every 6 (six) hours as needed.    Historical Provider, MD  Alain Honey  Triphasic (ENPRESSE-28 PO) Take 1 tablet by mouth daily.    Historical Provider, MD  levothyroxine (SYNTHROID, LEVOTHROID) 75 MCG tablet Take 75 mcg by mouth daily before breakfast.    Historical Provider, MD  ondansetron (ZOFRAN ODT) 4 MG disintegrating tablet 4mg  ODT q4 hours prn nausea/vomit 07/24/15   Richardean Canal, MD  pantoprazole (PROTONIX) 20 MG tablet Take 1 tablet (20 mg total) by mouth daily. 08/09/15   Linwood Dibbles, MD  venlafaxine XR (EFFEXOR-XR) 150 MG 24 hr capsule Take 150 mg by mouth daily with breakfast.    Historical Provider, MD    ALLERGIES:  No Known Allergies  SOCIAL HISTORY:  Social History  Substance Use Topics  . Smoking status: Current Every Day Smoker -- 0.50 packs/day  . Smokeless tobacco: Not on file  . Alcohol Use: 1.2 oz/week    2 Glasses of wine per week     Comment: socially    FAMILY HISTORY: Family History  Problem Relation Age of Onset  . Cancer Mother     breast and ovarian  . Hypertension Mother   . Diabetes Mother   . Hypertension Father   . Cancer Maternal Grandmother     colon    EXAM: BP 115/82 mmHg  Pulse 71  Temp(Src) 97.4 F (36.3 C)  Resp 22  SpO2 100%  LMP 08/21/2015 CONSTITUTIONAL: Alert and oriented and responds appropriately to questions. well-nourished; obese, appears uncomfortable. Afebrile, and non-toxic appearing. HEAD: Normocephalic EYES: Conjunctivae clear, PERRL  ENT: normal nose; no rhinorrhea; moist mucous membranes; pharynx without lesions noted NECK: Supple, no meningismus, no LAD  CARD: Regular rate, tachycardic; S1 and S2 appreciated; no murmurs, no clicks, no rubs, no gallops RESP: Normal chest excursion without splinting or tachypnea; breath sounds clear and equal bilaterally; no wheezes, no rhonchi, no rales, no hypoxia or respiratory distress, speaking full sentences ABD/GI: Normal bowel sounds; non-distended; soft, no rebound, no guarding, no peritoneal signs, TTP in epigastric region and RUQ with negative  Murphy's sign and no TTP at McBurney's point BACK:  The back appears normal and is non-tender to palpation, there is no CVA tenderness EXT: Normal ROM in all joints; non-tender to palpation; no edema; normal capillary refill; no cyanosis, no calf tenderness or swelling    SKIN: Normal color for age and race; warm NEURO: Moves all extremities equally, sensation to light touch intact diffusely, cranial nerves II through XII intact PSYCH: The patient's mood and manner are appropriate. Grooming and personal hygiene are appropriate.  MEDICAL DECISION MAKING: Patient here with tenderness in the epigastric region and right upper quadrant. She was seen in the emergency department on July 25 and had a negative right upper quadrant ultrasound. She had a CT abdomen and pelvis that showed some pericholecystic fluid but no gallstones or gallbladder wall thickening. She had a normal HIDA scan as well. Came back to the emergency department on August 10 with similar pain and had normal labs. No repeat imaging done at that time. Pain was controlled in the ED both times during these visits. She is followed up with Dr. Evette Cristal with gastroenterology. She has another HIDA scan scheduled next week.  No history of endoscopy. We'll repeat labs today and provide Protonix, GI cocktail, Dilaudid and Zofran. I do not feel at this time she necessarily needs repeat imaging unless labs are unchanged or pain is uncontrolled.  ED PROGRESS: Patient's labs are unremarkable. No leukocytosis. Normal LFTs and lipase. She continues to have significant pain despite multiple rounds of IV narcotics. We'll obtain right upper quadrant ultrasound.   3:30 AM  Patient's right upper quadrant ultrasound shows hepatic steatosis without other abnormality. Her pain is still uncontrolled. D/w Dr. Bosie Clos who is on call for Dr. Evette Cristal with gastroenterology. Discussed patient's case. He agrees with medical admission and they will see the patient in consult.  Will continue IV pain medication for pain control. We'll keep her nothing by mouth.   3:40 AM  D/w Dr. Toniann Fail for admission to medical bed.  Patient comfortable with this plan.   EKG Interpretation  Date/Time:  Tuesday September 05 2015 00:27:34 EDT Ventricular Rate:  70 PR Interval:  119 QRS Duration: 81 QT Interval:  416 QTC Calculation: 449 R Axis:   7 Text Interpretation:  Sinus rhythm Borderline short PR interval No significant change since last tracing Confirmed by Crystalyn Delia,  DO, Athziri Freundlich [phone]) on 09/05/2015 12:30:20 AM        I personally performed the services described in this documentation, which was scribed in my presence. The recorded information has been reviewed and is accurate.   Layla Maw Darlena Koval, DO 09/05/15 [phone]

## 2015-09-05 NOTE — ED Notes (Signed)
MD at bedside. 

## 2015-09-05 NOTE — ED Notes (Signed)
PT transported to Xray

## 2015-09-06 ENCOUNTER — Encounter (HOSPITAL_COMMUNITY): Payer: Self-pay

## 2015-09-06 ENCOUNTER — Encounter (HOSPITAL_COMMUNITY)
Admission: EM | Disposition: A | Discharge status: Home / Self Care | Payer: Self-pay | Source: Home / Self Care | Attending: Emergency Medicine

## 2015-09-06 DIAGNOSIS — R1011 Right upper quadrant pain: Secondary | ICD-10-CM

## 2015-09-06 DIAGNOSIS — K828 Other specified diseases of gallbladder: Secondary | ICD-10-CM | POA: Diagnosis not present

## 2015-09-06 DIAGNOSIS — F411 Generalized anxiety disorder: Secondary | ICD-10-CM | POA: Diagnosis not present

## 2015-09-06 DIAGNOSIS — R030 Elevated blood-pressure reading, without diagnosis of hypertension: Secondary | ICD-10-CM | POA: Diagnosis not present

## 2015-09-06 DIAGNOSIS — E039 Hypothyroidism, unspecified: Secondary | ICD-10-CM | POA: Diagnosis not present

## 2015-09-06 HISTORY — PX: ESOPHAGOGASTRODUODENOSCOPY: SHX5428

## 2015-09-06 LAB — RAPID URINE DRUG SCREEN, HOSP PERFORMED
Amphetamines: NOT DETECTED
BARBITURATES: POSITIVE — AB
Benzodiazepines: POSITIVE — AB
Cocaine: NOT DETECTED
OPIATES: POSITIVE — AB
TETRAHYDROCANNABINOL: NOT DETECTED

## 2015-09-06 SURGERY — EGD (ESOPHAGOGASTRODUODENOSCOPY)
Anesthesia: Moderate Sedation

## 2015-09-06 MED ORDER — FENTANYL CITRATE (PF) 100 MCG/2ML IJ SOLN
INTRAMUSCULAR | Status: AC
Start: 2015-09-06 — End: 2015-09-06
  Filled 2015-09-06: qty 2

## 2015-09-06 MED ORDER — SODIUM CHLORIDE 0.9 % IV SOLN: INTRAVENOUS | Status: DC

## 2015-09-06 MED ORDER — FENTANYL CITRATE (PF) 100 MCG/2ML IJ SOLN
INTRAMUSCULAR | Status: DC | PRN
  Administered 2015-09-06 (×3): 25 ug via INTRAVENOUS

## 2015-09-06 MED ORDER — MIDAZOLAM HCL 10 MG/2ML IJ SOLN
INTRAMUSCULAR | Status: DC | PRN
  Administered 2015-09-06: 2 mg via INTRAVENOUS
  Administered 2015-09-06: 1 mg via INTRAVENOUS
  Administered 2015-09-06: 2 mg via INTRAVENOUS

## 2015-09-06 MED ORDER — DIPHENHYDRAMINE HCL 50 MG/ML IJ SOLN
INTRAMUSCULAR | Status: DC | PRN
  Administered 2015-09-06 (×2): 25 mg via INTRAVENOUS

## 2015-09-06 MED ORDER — BUTAMBEN-TETRACAINE-BENZOCAINE 2-2-14 % EX AERO
INHALATION_SPRAY | CUTANEOUS | Status: DC | PRN
  Administered 2015-09-06: 2 via TOPICAL

## 2015-09-06 MED ORDER — MIDAZOLAM HCL 5 MG/ML IJ SOLN
INTRAMUSCULAR | Status: AC
Start: 2015-09-06 — End: 2015-09-06
  Filled 2015-09-06: qty 2

## 2015-09-06 MED ORDER — KETOROLAC TROMETHAMINE 30 MG/ML IJ SOLN
30.0000 mg | Freq: Four times a day (QID) | INTRAMUSCULAR | Status: DC | PRN
  Administered 2015-09-06 – 2015-09-09 (×7): 30 mg via INTRAVENOUS
  Filled 2015-09-06 (×6): qty 1

## 2015-09-06 MED ORDER — DIPHENHYDRAMINE HCL 50 MG/ML IJ SOLN
INTRAMUSCULAR | Status: AC
  Filled 2015-09-06: qty 1

## 2015-09-06 NOTE — Consult Note (Signed)
Emma [name] [birth year]  [id number].   Requesting MD: Dr. Clarene Essex Chief Complaint/Reason for Consult: abdominal pain HPI: This is an otherwise relatively healthy 39 yo white female who had her first episode of epigastric and RUQ abdominal pain in July.  It was not associated with food.  It did cause some nausea.  She presented to the [facility] where she had a CT scan that revealed a small amount of pericholecystic fluid and a distended gallbladder. She had an [place] that was normal with no gallstones.  She underwent a HIDA scan which was negative for cholecystitis.  She was discharged home.  She did well until another episode in August that brought her back to the ED.  Her pain was relieved with pain medications and she was discharged again with follow up with GI.  Once again she began having pain again yesterday.  She had some nausea and vomiting.  Her labs are all normal.  She was admitted to the hospital this time.  GI was consulted who performed an EGD today which was also negative.  We have been asked to see her to discuss possible cholecystectomy.  The patient admits to some bloating, denies belching, denies much NSAID use.  Food does not help or hurt her.  Nothing seems to make her pain better except pain medication.  ROS : Please see HPI, otherwise all other systems are currently negative  Family History  Problem Relation Age of Onset  . Cancer Mother     breast and ovarian  . Hypertension Mother   . Diabetes Mother   . Hypertension Father   . Cancer Maternal Grandmother     colon    Past Medical History  Diagnosis Date  . Depression   . Thyroid disease   . Family history of breast cancer   . Leg mass     left    Past Surgical History  Procedure Laterality Date  . Fracture surgery  1996    right hand    Social History:  reports that she has been smoking.  She does not have any smokeless tobacco history on file. She reports that she drinks about 1.2 oz of alcohol per week.  Her drug history is not on file.  Allergies: No Known Allergies  Medications Prior to Admission  Medication Sig Dispense Refill  . ALPRAZolam (XANAX) 0.25 MG tablet Take 0.25 mg by mouth 2 (two) times daily as needed for anxiety.     . diphenhydramine-acetaminophen (TYLENOL PM) 25-500 MG TABS Take 1 tablet by mouth at bedtime as needed.    Marland Kitchen ibuprofen (ADVIL,MOTRIN) 200 MG tablet Take 400 mg by mouth every 6 (six) hours as needed for moderate pain.     N[place]ta Lights Triphasic (ENPRESSE-28 PO) Take 1 tablet by mouth daily.    Marland Kitchen levothyroxine (SYNTHROID, LEVOTHROID) 75 MCG tablet Take 75 mcg by mouth daily before breakfast.    . venlafaxine XR (EFFEXOR-XR) 150 MG 24 hr capsule Take 150 mg by mouth daily with breakfast.      Blood pressure 100/62, pulse 89, temperature 97.8 F (36.6 C), temperature source Oral, resp. rate 16, height 5' 2" (1.575 m), weight 113.399 kg (250 lb), last menstrual period 08/21/2015, SpO2 100 %. Physical Exam: General: pleasant, obese white female who is laying in bed in NAD HEENT: head is normocephalic, atraumatic.  Sclera are noninjected.  PERRL.  Ears and nose without any masses or lesions.  Mouth is pink and moist Heart: regular, rate, and rhythm.  Normal s1,s2. No obvious murmurs, gallops, or rubs noted.  Palpable radial and pedal pulses bilaterally Lungs: CTAB, no wheezes, rhonchi, or rales noted.  Respiratory effort nonlabored Abd: soft, minimally tender in RUQ and epigastrium, ND, +BS, no masses, hernias, or organomegaly MS: all 4 extremities are symmetrical with no cyanosis, clubbing, or edema. Skin: warm and dry with no masses, lesions, or rashes Psych: A&Ox3 with an appropriate affect.    Results for orders placed or performed during the hospital encounter of 09/04/15 (from the past 48 hour(s))  Lipase, blood     Status: None   Collection Time: 09/05/15 12:00 AM  Result Value Ref Range   Lipase 48 22 - 51 U/L  Comprehensive metabolic panel      Status: Abnormal   Collection Time: 09/05/15 12:00 AM  Result Value Ref Range   Sodium 137 135 - 145 mmol/L   Potassium 3.8 3.5 - 5.1 mmol/L   Chloride 102 101 - 111 mmol/L   CO2 26 22 - 32 mmol/L   Glucose, Bld 126 (H) 65 - 99 mg/dL   BUN 11 6 - 20 mg/dL   Creatinine, Ser 0.78 0.44 - 1.00 mg/dL   Calcium 8.7 (L) 8.9 - 10.3 mg/dL   Total Protein 6.1 (L) 6.5 - 8.1 g/dL   Albumin 3.2 (L) 3.5 - 5.0 g/dL   AST 20 15 - 41 U/L   ALT 14 14 - 54 U/L   Alkaline Phosphatase 57 38 - 126 U/L   Total Bilirubin 0.4 0.3 - 1.2 mg/dL   GFR calc non Af Amer >60 >60 mL/min   GFR calc Af Amer >60 >60 mL/min    Comment: (NOTE) The eGFR has been calculated using the CKD EPI equation. This calculation has not been validated in all clinical situations. eGFR's persistently <60 mL/min signify possible Chronic Kidney Disease.    Anion gap 9 5 - 15  CBC     Status: None   Collection Time: 09/05/15 12:00 AM  Result Value Ref Range   WBC 8.3 4.0 - 10.5 K/uL   RBC 3.92 3.87 - 5.11 MIL/uL   Hemoglobin 12.4 12.0 - 15.0 g/dL   HCT 37.8 36.0 - 46.0 %   MCV 96.4 78.0 - 100.0 fL   MCH 31.6 26.0 - 34.0 pg   MCHC 32.8 30.0 - 36.0 g/dL   RDW 13.9 11.5 - 15.5 %   Platelets 353 150 - 400 K/uL  POCT i-Stat troponin I     Status: None   Collection Time: 09/05/15 12:19 AM  Result Value Ref Range   Troponin i, poc 0.00 0.00 - 0.08 ng/mL   Comment 3            Comment: Due to the release kinetics of cTnI, a negative result within the first hours of the onset of symptoms does not rule out myocardial infarction with certainty. If myocardial infarction is still suspected, repeat the test at appropriate intervals.   Urinalysis, Routine w reflex microscopic (not at [facility])     Status: Abnormal   Collection Time: 09/05/15  1:01 AM  Result Value Ref Range   Color, Urine YELLOW YELLOW   APPearance CLOUDY (A) CLEAR   Specific Gravity, Urine 1.025 1.005 - 1.030   pH 6.0 5.0 - 8.0   Glucose, UA NEGATIVE NEGATIVE  mg/dL   Hgb urine dipstick NEGATIVE NEGATIVE   Bilirubin Urine SMALL (A) NEGATIVE   Ketones, ur 15 (A) NEGATIVE mg/dL   Protein, ur NEGATIVE NEGATIVE mg/dL  Urobilinogen, UA 1.0 0.0 - 1.0 mg/dL   Nitrite NEGATIVE NEGATIVE   Leukocytes, UA NEGATIVE NEGATIVE    Comment: MICROSCOPIC NOT DONE ON URINES WITH NEGATIVE PROTEIN, BLOOD, LEUKOCYTES, NITRITE, OR GLUCOSE <1000 mg/dL.  POC urine preg, ED (not at [facility])     Status: None   Collection Time: 09/05/15  1:05 AM  Result Value Ref Range   Preg Test, Ur NEGATIVE NEGATIVE    Comment:        THE SENSITIVITY OF THIS METHODOLOGY IS >24 mIU/mL   Lipase, blood     Status: None   Collection Time: 09/05/15  6:09 AM  Result Value Ref Range   Lipase 23 22 - 51 U/L  Hepatic function panel     Status: Abnormal   Collection Time: 09/05/15  6:09 AM  Result Value Ref Range   Total Protein 6.0 (L) 6.5 - 8.1 g/dL   Albumin 2.9 (L) 3.5 - 5.0 g/dL   AST 17 15 - 41 U/L   ALT 14 14 - 54 U/L   Alkaline Phosphatase 48 38 - 126 U/L   Total Bilirubin 0.2 (L) 0.3 - 1.2 mg/dL   Bilirubin, Direct <0.1 (L) 0.1 - 0.5 mg/dL   Indirect Bilirubin NOT CALCULATED 0.3 - 0.9 mg/dL  Basic metabolic panel     Status: Abnormal   Collection Time: 09/05/15  6:09 AM  Result Value Ref Range   Sodium 139 135 - 145 mmol/L   Potassium 3.9 3.5 - 5.1 mmol/L   Chloride 104 101 - 111 mmol/L   CO2 27 22 - 32 mmol/L   Glucose, Bld 106 (H) 65 - 99 mg/dL   BUN 8 6 - 20 mg/dL   Creatinine, Ser 0.79 0.44 - 1.00 mg/dL   Calcium 8.4 (L) 8.9 - 10.3 mg/dL   GFR calc non Af Amer >60 >60 mL/min   GFR calc Af Amer >60 >60 mL/min    Comment: (NOTE) The eGFR has been calculated using the CKD EPI equation. This calculation has not been validated in all clinical situations. eGFR's persistently <60 mL/min signify possible Chronic Kidney Disease.    Anion gap 8 5 - 15  CBC WITH DIFFERENTIAL     Status: Abnormal   Collection Time: 09/05/15  6:09 AM  Result Value Ref Range   WBC 9.4 4.0 -  10.5 K/uL   RBC 3.66 (L) 3.87 - 5.11 MIL/uL   Hemoglobin 11.1 (L) 12.0 - 15.0 g/dL   HCT 35.2 (L) 36.0 - 46.0 %   MCV 96.2 78.0 - 100.0 fL   MCH 30.3 26.0 - 34.0 pg   MCHC 31.5 30.0 - 36.0 g/dL   RDW 14.0 11.5 - 15.5 %   Platelets 298 150 - 400 K/uL   Neutrophils Relative % 69 43 - 77 %   Neutro Abs 6.5 1.7 - 7.7 K/uL   Lymphocytes Relative 27 12 - 46 %   Lymphs Abs 2.6 0.7 - 4.0 K/uL   Monocytes Relative 3 3 - 12 %   Monocytes Absolute 0.3 0.1 - 1.0 K/uL   Eosinophils Relative 1 0 - 5 %   Eosinophils Absolute 0.1 0.0 - 0.7 K/uL   Basophils Relative 0 0 - 1 %   Basophils Absolute 0.0 0.0 - 0.1 K/uL   Dg Abd Acute W/chest  09/05/2015   CLINICAL DATA:  Right upper quadrant pain on off for 2 months but worse tonight.  EXAM: DG ABDOMEN ACUTE W/ 1V CHEST  COMPARISON:  CT abdomen and  pelvis 07/24/2015  FINDINGS: Normal heart size and pulmonary vascularity. No focal airspace disease or consolidation in the lungs. No blunting of costophrenic angles. No pneumothorax. Mediastinal contours appear intact.  Scattered gas and stool in the colon. No small or large bowel distention. No free intra-abdominal air. No abnormal air-fluid levels. No radiopaque stones. Visualized bones appear intact.  IMPRESSION: No evidence of active pulmonary disease. Nonobstructive bowel gas pattern.   Electronically Signed   By: Lucienne Capers M.D.   On: 09/05/2015 04:28   US Abdomen Limited Ruq  09/05/2015   CLINICAL DATA:  39 year old female with right upper quadrant abdominal pain.  EXAM: US ABDOMEN LIMITED - RIGHT UPPER QUADRANT  COMPARISON:  CT, ultrasound, and Hepatobiliary scintigraphy dated 07/24/2015  FINDINGS: Gallbladder:  No gallstones or wall thickening visualized. No sonographic Murphy sign noted.  Common bile duct:  Diameter: 4 mm  Liver:  There is mild diffuse increase in hepatic echogenicity compatible with fatty infiltration.  IMPRESSION: No gallstone.   Electronically Signed   By: Anner Crete M.D.   On:  09/05/2015 02:21       Assessment/Plan 1. Abdominal pain -patient's symptoms are not classic for a biliary source, but her other workups have been negative so far.  She is scheduled to get a repeat HIDA tomorrow with EF.  I have discussed the possibility of offering a lap chole to her but unable to guarantee whether that would fix her problem or not.  She was a little unsure about this option.  I told her to think about.  In the meantime, we will see what her HIDA scan shows.  If her pain resolves and she can eat, she could always follow up with [place] as an outpatient if she decides she would like a surgery and set this up electively.  I discussed this with her as well.   -we will follow along.  Jaydis Duchene E 09/06/2015, 1:57 PM Pager: [phone]

## 2015-09-06 NOTE — Progress Notes (Addendum)
[facility] PROGRESS NOTE  INNOCENCE SCHLOTZHAUER ZOX:[id number] DOB: [birth year] DOA: 09/04/2015 PCP: REDMON,NOELLE, PA-C  Brief Summary  Emma [name] is a 39 y.o. female with history of hypothyroidism and anxiety and depression presents to the ER because of abdominal pain and nausea vomiting. Patient has been having these symptoms since last evening. Patient states the pain is mostly in the right upper quadrant epigastric area. Patient has had multiple episodes of nausea and vomiting. Patient was given at least a dose of Dilaudid for pain relief. Patient has no symptoms on August 10 and July 25 of this year. CT scan of the abdomen done at that time in July was showing pericholecystic fluid which is followed by a normal HIDA scan. At this time abdominal sonogram in the ER does not show anything acute. Labs are also not showing anything acute. Patient had followed up with Dr. Evette Cristal, gastroenterologist for these complaints recently. On-call gastroenterologist Dr. Bosie Clos was consulted by the ER physician and they'll be seeing patient in consult. Patient has been admitted for further observation. Denies any diarrhea fever chills.  Assessment/Plan  Abdominal pain with nausea and vomiting, RUQ pain without evidence of gallstones on previous imaging.  Negative HIDA scan without EF on 07/24/2015.  Concern for biliary dyskinesia.   -  EGD today:  No explanation for symptoms.  Small hiatal hernia -  Plan for HIDA CCK tomorrow (given dilaudid after EGD today which deferred exam) -  Appreciate GI and general surgery assistance -  Trial of regular diet today and then NPO at MN  Hypothyroidism, stable, continue levothyroxine  Anxiety and depression, stable, continue Effexor-XR and xanax prn  Diet:  regular Access:  PIV IVF:  off Proph:  SCDs  Code Status: full Family Communication: patient alone Disposition Plan: pending    Consultants:  Gastroenterology, Dr. Ewing Schlein  Procedures:  EGD on  9/7  Antibiotics:  none   HPI/Subjective:  States that she feels hungry and that she has not eaten in several days.  Her pain is a little better but she is annoyed by it and wants to figure out how to make it go away permanently.  Her pain medication typically only works for a few hours.       Objective: Filed Vitals:   09/06/15 0850 09/06/15 0900 09/06/15 1011 09/06/15 1441  BP: 125/49 112/59 100/62 110/49  Pulse: 96 80 89 79  Temp:   97.8 F (36.6 C) 98.7 F (37.1 C)  TempSrc:   Oral Oral  Resp: Height:      Weight:      SpO2: 97% 95% 100% 100%    Intake/Output Summary (Last 24 hours) at 09/06/15 1659 Last data filed at 09/06/15 1300  Gross per 24 hour  Intake 2150.34 ml  Output    702 ml  Net 1448.34 ml   Filed Weights   09/05/15 0453 09/06/15 0736  Weight: 113.4 kg (250 lb) 113.399 kg (250 lb)   Body mass index is 45.71 kg/(m^2).  Exam:   General:  Obese female, No acute distress  HEENT:  NCAT, MMM  Cardiovascular:  RRR, nl S1, S2 no mrg, 2+ pulses, warm extremities  Respiratory:  CTAB, no increased WOB  Abdomen:   NABS, soft, mild TTP in the RUQ without rebound or guarding  MSK:   Normal tone and bulk, no LEE  Neuro:  Grossly intact  Data Reviewed: Basic Metabolic Panel:  Recent Labs Lab 09/05/15 09/05/15 0609  NA 137  139  K 3.8 3.9  CL 102 104  CO2 26 27  GLUCOSE 126* 106*  BUN 11 8  CREATININE 0.78 0.79  CALCIUM 8.7* 8.4*   Liver Function Tests:  Recent Labs Lab 09/05/15 09/05/15 0609  AST 20 17  ALT 14 14  ALKPHOS 57 48  BILITOT 0.4 0.2*  PROT 6.1* 6.0*  ALBUMIN 3.2* 2.9*    Recent Labs Lab 09/05/15 09/05/15 0609  LIPASE 48 23   No results for input(s): AMMONIA in the last 168 hours. CBC:  Recent Labs Lab 09/05/15 09/05/15 0609  WBC 8.3 9.4  NEUTROABS  --  6.5  HGB 12.4 11.1*  HCT 37.8 35.2*  MCV 96.4 96.2  PLT 353 298    No results found for this or any previous visit (from the past 240  hour(s)).   Studies: Dg Abd Acute W/chest  09/05/2015   CLINICAL DATA:  Right upper quadrant pain on off for 2 months but worse tonight.  EXAM: DG ABDOMEN ACUTE W/ 1V CHEST  COMPARISON:  CT abdomen and pelvis 07/24/2015  FINDINGS: Normal heart size and pulmonary vascularity. No focal airspace disease or consolidation in the lungs. No blunting of costophrenic angles. No pneumothorax. Mediastinal contours appear intact.  Scattered gas and stool in the colon. No small or large bowel distention. No free intra-abdominal air. No abnormal air-fluid levels. No radiopaque stones. Visualized bones appear intact.  IMPRESSION: No evidence of active pulmonary disease. Nonobstructive bowel gas pattern.   Electronically Signed   By: Burman Nieves M.D.   On: 09/05/2015 04:28   US Abdomen Limited Ruq  09/05/2015   CLINICAL DATA:  39 year old female with right upper quadrant abdominal pain.  EXAM: US ABDOMEN LIMITED - RIGHT UPPER QUADRANT  COMPARISON:  CT, ultrasound, and Hepatobiliary scintigraphy dated 07/24/2015  FINDINGS: Gallbladder:  No gallstones or wall thickening visualized. No sonographic Murphy sign noted.  Common bile duct:  Diameter: 4 mm  Liver:  There is mild diffuse increase in hepatic echogenicity compatible with fatty infiltration.  IMPRESSION: No gallstone.   Electronically Signed   By: Elgie Collard M.D.   On: 09/05/2015 02:21    Scheduled Meds: . antiseptic oral rinse  7 mL Mouth Rinse q12n4p  . chlorhexidine  15 mL Mouth Rinse BID  . levothyroxine  75 mcg Oral QAC breakfast  . pantoprazole (PROTONIX) IV  40 mg Intravenous Q12H  . venlafaxine XR  150 mg Oral Q breakfast   Continuous Infusions:   Active Problems:   Abdominal pain   Hypothyroidism    Time spent: 30 min    Emma [name], [facility]  [facility] Pager [phone]. If 7PM-7AM, please contact night-coverage at [email or web address], password [facility] 09/06/2015, 4:59 PM

## 2015-09-06 NOTE — Op Note (Signed)
[facility] [address] [place], [address]   ENDOSCOPY PROCEDURE REPORT  PATIENT: Emma [name], Emma [name]  MR#: [id number] BIRTHDATE: 1976-04-09 , 39  yrs. old GENDER: female ENDOSCOPIST: Vida Rigger, MD REFERRED BY: PROCEDURE DATE:  September 13, 2015 PROCEDURE:  EGD, diagnostic ASA CLASS:     Class II INDICATIONS:  abdominal pain in the upper right quadrant. MEDICATIONS: Fentanyl 75 mcg IV and Versed 5 mg IV   Benadryl 50 mg IV TOPICAL ANESTHETIC: Cetacaine Spray  DESCRIPTION OF PROCEDURE: After the risks benefits and alternatives of the procedure were thoroughly explained, informed consent was obtained.  The Pentax Gastroscope [id number] endoscope was introduced through the mouth and advanced to the third portion of the duodenum , Without limitations.  The instrument was slowly withdrawn as the mucosa was fully examined. Estimated blood loss is zero unless otherwise noted in this procedure report.    the findings are recorded below       Retroflexed views revealed no abnormalities.     The scope was then withdrawn from the patient and the procedure completed.  COMPLICATIONS: There were no immediate complications.  ENDOSCOPIC IMPRESSION: 1. Tiny hiatal hernia 2. Otherwise within normal limits to the third portion of the duodenum  RECOMMENDATIONS: CCK HIDA scan and probable surgical consult and questionable MRCP to complete workup  REPEAT EXAM: as needed  eSigned:  Vida Rigger, MD 2015/09/13 8:44 AM    CC:  CPT CODES: ICD CODES:  The ICD and CPT codes recommended by this software are interpretations from the data that the clinical staff has captured with the software.  The verification of the translation of this report to the ICD and CPT codes and modifiers is the sole responsibility of the health care institution and practicing physician where this report was generated.  [employer] will not be held responsible for the validity  of the ICD and CPT codes included on this report.  AMA assumes no liability for data contained or not contained herein. CPT is a Publishing rights manager of the [employer].  PATIENT NAME:  Kitiara, Hintze MR#: [id number]

## 2015-09-06 NOTE — Progress Notes (Signed)
Emma [name] 8:22 AM  Subjective: Patient without any new complaints but still with pain but no other symptoms  Objective: Vital signs stable afebrile no acute distress abdomen is soft nontender no guarding or rebound exam please see preassessment evaluation no new labs  Assessment: Right upper quadrant pain  Plan: Okay to proceed with endoscopy today with further workup and plans pending those findings  [facility] E  Pager [phone] After 5PM or if no answer call [phone]

## 2015-09-07 ENCOUNTER — Encounter (HOSPITAL_COMMUNITY): Payer: Self-pay | Admitting: Gastroenterology

## 2015-09-07 ENCOUNTER — Observation Stay (HOSPITAL_COMMUNITY): Payer: 59

## 2015-09-07 DIAGNOSIS — E039 Hypothyroidism, unspecified: Secondary | ICD-10-CM | POA: Diagnosis not present

## 2015-09-07 DIAGNOSIS — R1011 Right upper quadrant pain: Secondary | ICD-10-CM | POA: Diagnosis not present

## 2015-09-07 LAB — SURGICAL PCR SCREEN
MRSA, PCR: NEGATIVE
Staphylococcus aureus: NEGATIVE

## 2015-09-07 MED ORDER — SINCALIDE 5 MCG IJ SOLR
INTRAMUSCULAR | Status: AC
  Administered 2015-09-07: 2.3 ug via INTRAVENOUS
  Filled 2015-09-07: qty 10

## 2015-09-07 MED ORDER — SINCALIDE 5 MCG IJ SOLR
0.0200 ug/kg | Freq: Once | INTRAMUSCULAR | Status: AC
  Administered 2015-09-07: 2.3 ug via INTRAVENOUS

## 2015-09-07 MED ORDER — TECHNETIUM TC 99M MEBROFENIN IV KIT
5.0000 | PACK | Freq: Once | INTRAVENOUS | Status: DC | PRN
  Administered 2015-09-07: 5 via INTRAVENOUS
  Filled 2015-09-07: qty 6

## 2015-09-07 MED ORDER — STERILE WATER FOR INJECTION IJ SOLN
INTRAMUSCULAR | Status: AC
  Administered 2015-09-07: 10 mL
  Filled 2015-09-07: qty 10

## 2015-09-07 MED ORDER — OXYCODONE HCL 5 MG PO TABS
5.0000 mg | ORAL_TABLET | ORAL | Status: DC | PRN
  Administered 2015-09-07 – 2015-09-08 (×5): 5 mg via ORAL
  Filled 2015-09-07 (×5): qty 1

## 2015-09-07 MED ORDER — DEXTROSE 5 % IV SOLN
2.0000 g | INTRAVENOUS | Status: AC
  Administered 2015-09-08: 2 g via INTRAVENOUS
  Filled 2015-09-07: qty 2

## 2015-09-07 NOTE — Progress Notes (Signed)
[facility] PROGRESS NOTE  Emma [name] AOZ:[id number] DOB: 1976/10/04 DOA: 09/04/2015 PCP: REDMON,NOELLE, PA-C  Brief Summary  Emma [name] is a 40 y.o. female with history of hypothyroidism and anxiety and depression presents to the ER because of abdominal pain and nausea vomiting. Patient has been having these symptoms since last evening. Patient states the pain is mostly in the right upper quadrant epigastric area. Patient has had multiple episodes of nausea and vomiting. Patient was given at least a dose of Dilaudid for pain relief. Patient has no symptoms on August 10 and July 25 of this year. CT scan of the abdomen done at that time in July was showing pericholecystic fluid which is followed by a normal HIDA scan. At this time abdominal sonogram in the ER does not show anything acute. Labs are also not showing anything acute. Patient had followed up with Dr. Evette Cristal, gastroenterologist for these complaints recently. On-call gastroenterologist Dr. Bosie Clos was consulted by the ER physician and they'll be seeing patient in consult. Patient has been admitted for further observation. Denies any diarrhea fever chills.  Assessment/Plan  Abdominal pain with nausea and vomiting, RUQ pain without evidence of gallstones on previous imaging.  Negative HIDA scan without EF on 07/24/2015.  Concern for biliary dyskinesia.   -  EGD today:  No explanation for symptoms.  Small hiatal hernia -  HIDA CCK abnormal with only 20% emptying -  Appreciate GI and general surgery assistance -  Regular diet today and then NPO at [place] if she wants to pursue surgery.  She will discuss with her husband and get back to [place] later  Hypothyroidism, stable, continue levothyroxine  Anxiety and depression, stable, continue Effexor-XR and xanax prn  Diet:  regular Access:  PIV IVF:  off Proph:  SCDs  Code Status: full Family Communication: patient alone Disposition Plan: pending decision about  surgery   Consultants:  Gastroenterology, Dr. Ewing Schlein  General surgery, Dr. Violeta Gelinas  Procedures:  EGD on 9/7  HIDA with CCK on 9/8  Antibiotics:  none   HPI/Subjective:  States she is continuing to have severe RUQ pain.  Trying to decide about surgery.    Objective: Filed Vitals:   09/06/15 1441 09/06/15 2245 09/07/15 0540 09/07/15 1330  BP: 110/49 114/65 117/52 123/56  Pulse: 79 79 90 78  Temp: 98.7 F (37.1 C) 98.5 F (36.9 C) 98.5 F (36.9 C) 98.2 F (36.8 C)  TempSrc: Oral Oral Oral Oral  Resp: Height:      Weight:      SpO2: 100% 94% 97% 92%    Intake/Output Summary (Last 24 hours) at 09/07/15 1400 Last data filed at 09/07/15 1330  Gross per 24 hour  Intake 1061.33 ml  Output      0 ml  Net 1061.33 ml   Filed Weights   09/05/15 0453 09/06/15 0736  Weight: 113.4 kg (250 lb) 113.399 kg (250 lb)   Body mass index is 45.71 kg/(m^2).  Exam:   General:  Obese female, No acute distress  HEENT:  NCAT, MMM  Cardiovascular:  RRR, nl S1, S2 no mrg, 2+ pulses, warm extremities  Respiratory:  CTAB, no increased WOB  Abdomen:   NABS, soft, mild TTP in the RUQ without rebound or guarding  MSK:   Normal tone and bulk, no LEE  Neuro:  Grossly intact  Data Reviewed: Basic Metabolic Panel:  Recent Labs Lab 09/05/15 09/05/15 0609  NA 137 139  K 3.8 3.9  CL 102 104  CO2 26 27  GLUCOSE 126* 106*  BUN 11 8  CREATININE 0.78 0.79  CALCIUM 8.7* 8.4*   Liver Function Tests:  Recent Labs Lab 09/05/15 09/05/15 0609  AST 20 17  ALT 14 14  ALKPHOS 57 48  BILITOT 0.4 0.2*  PROT 6.1* 6.0*  ALBUMIN 3.2* 2.9*    Recent Labs Lab 09/05/15 09/05/15 0609  LIPASE 48 23   No results for input(s): AMMONIA in the last 168 hours. CBC:  Recent Labs Lab 09/05/15 09/05/15 0609  WBC 8.3 9.4  NEUTROABS  --  6.5  HGB 12.4 11.1*  HCT 37.8 35.2*  MCV 96.4 96.2  PLT 353 298    No results found for this or any previous visit (from  the past 240 hour(s)).   Studies: Nm Hepato W/eject Fract  09/07/2015   CLINICAL DATA:  Generalized abdominal pain, RIGHT upper quadrant pain with nausea and vomiting for past 2 months  EXAM: NUCLEAR MEDICINE HEPATOBILIARY IMAGING WITH GALLBLADDER EF  TECHNIQUE: Sequential images of the abdomen were obtained out to 60 minutes following intravenous administration of radiopharmaceutical. After slow intravenous infusion of 2.27 micrograms Cholecystokinin, gallbladder ejection fraction was determined.  RADIOPHARMACEUTICALS:  5 mCi Tc-61m Choletec IV  COMPARISON:  07/24/2015  FINDINGS: Normal tracer extraction from bloodstream indicating normal hepatocellular function.  Normal excretion of tracer into biliary tree.  Gallbladder visualized at 34 min.  Gallbladder tracer accumulation confirmed with RIGHT lateral view.  Small bowel visualized at 12 min.  No hepatic retention of tracer.  Subjectively decreased emptying of tracer from gallbladder following CCK administration.  Calculated gallbladder ejection fraction is 20%, abnormally low.  Patient reported no symptoms following CCK administration.  IMPRESSION: Patent biliary tree.  Abnormal gallbladder response to CCK stimulation with a decreased gallbladder ejection fraction of 20%.   Electronically Signed   By: Ulyses Southward M.D.   On: 09/07/2015 13:40    Scheduled Meds: . antiseptic oral rinse  7 mL Mouth Rinse q12n4p  . chlorhexidine  15 mL Mouth Rinse BID  . levothyroxine  75 mcg Oral QAC breakfast  . pantoprazole (PROTONIX) IV  40 mg Intravenous Q12H  . venlafaxine XR  150 mg Oral Q breakfast   Continuous Infusions:   Active Problems:   Abdominal pain   Hypothyroidism    Time spent: 30 min    Shelvy Perazzo, [facility]  [facility] Pager [phone]. If 7PM-7AM, please contact night-coverage at [email or web address], password [facility] 09/07/2015, 2:00 PM

## 2015-09-07 NOTE — Progress Notes (Signed)
Emma [name] 10:44 AM  Subjective: Patient without any post EGD problems and she tolerated regular food last night without any worsening symptoms and she is awaiting her scan and her case was discussed with the surgical team and the plan rediscussed with her  Objective: Vital signs stable afebrile no acute distress very minimal right upper quadrant discomfort nontender elsewhere  Assessment: Right upper quadrant pain questionable biliary dyskinesia  Plan: Await CCK HIDA scan and postprocedure surgical opinion and if they are not going to proceed with cholecystectomy tomorrow she can probably go home and if CCK and HIDA scan totally normal consider MRCP next which could be set up as an outpatient and then follow-up with me or Dr. Evette Cristal in the office  [facility]  Pager [phone] After 5PM or if no answer call [phone]

## 2015-09-07 NOTE — Progress Notes (Signed)
Patient ID: Emma [name], female   DOB: [birth year], 39 y.o.   MRN: [id number] 1 Day Post-Op  Subjective: Pt feels a bit better but still having some soreness in her epigastrium and RUQ  Objective: Vital signs in last 24 hours: Temp:  [97.8 F (36.6 C)-98.7 F (37.1 C)] 98.5 F (36.9 C) (09/08 0540) Pulse Rate:  [72-96] 90 (09/08 0540) Resp:  [9-20] 16 (09/08 0540) BP: (100-142)/(45-82) 117/52 mmHg (09/08 0540) SpO2:  [94 %-100 %] 97 % (09/08 0540) Last BM Date: 09/03/15  Intake/Output from previous day: 09/07 0701 - 09/08 0700 In: 1061.3 [P.O.:720; I.V.:341.3] Out: 1 [Urine:1] Intake/Output this shift:    PE: Abd: soft, still tender in epigastrium and RUQ, obese, +BS  Lab Results:   Recent Labs  09/05/15 09/05/15 0609  WBC 8.3 9.4  HGB 12.4 11.1*  HCT 37.8 35.2*  PLT 353 298   BMET  Recent Labs  09/05/15 09/05/15 0609  NA 137 139  K 3.8 3.9  CL 102 104  CO2 26 27  GLUCOSE 126* 106*  BUN 11 8  CREATININE 0.78 0.79  CALCIUM 8.7* 8.4*   PT/INR No results for input(s): LABPROT, INR in the last 72 hours. CMP     Component Value Date/Time   NA 139 09/05/2015 0609   K 3.9 09/05/2015 0609   CL 104 09/05/2015 0609   CO2 27 09/05/2015 0609   GLUCOSE 106* 09/05/2015 0609   BUN 8 09/05/2015 0609   CREATININE 0.79 09/05/2015 0609   CALCIUM 8.4* 09/05/2015 0609   PROT 6.0* 09/05/2015 0609   ALBUMIN 2.9* 09/05/2015 0609   AST 17 09/05/2015 0609   ALT 14 09/05/2015 0609   ALKPHOS 48 09/05/2015 0609   BILITOT 0.2* 09/05/2015 0609   GFRNONAA >60 09/05/2015 0609   GFRAA >60 09/05/2015 0609   Lipase     Component Value Date/Time   LIPASE 23 09/05/2015 0609       Studies/Results: No results found.  Anti-infectives: Anti-infectives    None       Assessment/Plan  1. Abdominal pain, unclear etiology -HIDA scan with CCK today -will make determination for surgery or not after this. -will follow     ADDENDUM 1435: HIDA with CCK shows EF of 20%.   She says the CCK did NOT reproduce her symptoms.  I explained to her that we could still offer a lap chole as she by test has biliary dyskinesia, but we could still not guarantee that this would resolved her symptoms.  She is unsure whether she wants to proceed or not.  She is going to call her husband and then let us know.  If she is agreeable, she will need to be NPO p MN and plan for OR tomorrow.  Loredana Medellin E 09/07/2015, 8:07 AM Pager: [phone]

## 2015-09-08 ENCOUNTER — Encounter (HOSPITAL_COMMUNITY)
Admission: EM | Disposition: A | Discharge status: Home / Self Care | Payer: Self-pay | Source: Home / Self Care | Attending: Emergency Medicine

## 2015-09-08 ENCOUNTER — Ambulatory Visit (HOSPITAL_COMMUNITY): Admission: RE | Admit: 2015-09-08 | Payer: 59 | Source: Ambulatory Visit

## 2015-09-08 ENCOUNTER — Observation Stay (HOSPITAL_COMMUNITY): Payer: 59 | Admitting: Anesthesiology

## 2015-09-08 ENCOUNTER — Encounter (HOSPITAL_COMMUNITY): Payer: Self-pay | Admitting: Anesthesiology

## 2015-09-08 DIAGNOSIS — E039 Hypothyroidism, unspecified: Secondary | ICD-10-CM | POA: Diagnosis not present

## 2015-09-08 DIAGNOSIS — R1011 Right upper quadrant pain: Secondary | ICD-10-CM | POA: Diagnosis not present

## 2015-09-08 HISTORY — PX: CHOLECYSTECTOMY: SHX55

## 2015-09-08 SURGERY — LAPAROSCOPIC CHOLECYSTECTOMY WITH INTRAOPERATIVE CHOLANGIOGRAM
Anesthesia: General

## 2015-09-08 MED ORDER — MEPERIDINE HCL 25 MG/ML IJ SOLN: 6.2500 mg | INTRAMUSCULAR | Status: DC | PRN

## 2015-09-08 MED ORDER — PROPOFOL 10 MG/ML IV BOLUS
INTRAVENOUS | Status: DC | PRN
  Administered 2015-09-08: 200 mg via INTRAVENOUS

## 2015-09-08 MED ORDER — LACTATED RINGERS IV SOLN: INTRAVENOUS | Status: DC

## 2015-09-08 MED ORDER — BUPIVACAINE-EPINEPHRINE 0.25% -1:200000 IJ SOLN
INTRAMUSCULAR | Status: DC | PRN
  Administered 2015-09-08: 17 mL

## 2015-09-08 MED ORDER — MORPHINE SULFATE (PF) 2 MG/ML IV SOLN
2.0000 mg | INTRAVENOUS | Status: DC | PRN
Start: 2015-09-08 — End: 2015-09-09
  Administered 2015-09-08 – 2015-09-09 (×3): 4 mg via INTRAVENOUS
  Filled 2015-09-08 (×3): qty 2

## 2015-09-08 MED ORDER — OXYCODONE HCL 5 MG/5ML PO SOLN: 5.0000 mg | Freq: Once | ORAL | Status: AC | PRN

## 2015-09-08 MED ORDER — PROMETHAZINE HCL 25 MG/ML IJ SOLN
6.2500 mg | INTRAMUSCULAR | Status: DC | PRN
  Administered 2015-09-08: 6.25 mg via INTRAVENOUS

## 2015-09-08 MED ORDER — OXYCODONE HCL 5 MG PO TABS
5.0000 mg | ORAL_TABLET | Freq: Once | ORAL | Status: AC | PRN
  Administered 2015-09-08: 5 mg via ORAL

## 2015-09-08 MED ORDER — PROMETHAZINE HCL 25 MG/ML IJ SOLN
INTRAMUSCULAR | Status: AC
  Filled 2015-09-08: qty 1

## 2015-09-08 MED ORDER — OXYCODONE HCL 5 MG PO TABS
ORAL_TABLET | ORAL | Status: AC
  Filled 2015-09-08: qty 1

## 2015-09-08 MED ORDER — HYDROMORPHONE HCL 1 MG/ML IJ SOLN
INTRAMUSCULAR | Status: AC
  Filled 2015-09-08: qty 1

## 2015-09-08 MED ORDER — ROCURONIUM BROMIDE 100 MG/10ML IV SOLN
INTRAVENOUS | Status: DC | PRN
  Administered 2015-09-08: 30 mg via INTRAVENOUS

## 2015-09-08 MED ORDER — LACTATED RINGERS IV SOLN
INTRAVENOUS | Status: DC | PRN
  Administered 2015-09-08: 16:00:00 via INTRAVENOUS

## 2015-09-08 MED ORDER — MIDAZOLAM HCL 5 MG/5ML IJ SOLN
INTRAMUSCULAR | Status: DC | PRN
  Administered 2015-09-08: 2 mg via INTRAVENOUS

## 2015-09-08 MED ORDER — SUCCINYLCHOLINE CHLORIDE 20 MG/ML IJ SOLN
INTRAMUSCULAR | Status: DC | PRN
  Administered 2015-09-08: 160 mg via INTRAVENOUS

## 2015-09-08 MED ORDER — SODIUM CHLORIDE 0.9 % IR SOLN
Status: DC | PRN
  Administered 2015-09-08: 1000 mL

## 2015-09-08 MED ORDER — HYDROMORPHONE HCL 1 MG/ML IJ SOLN
0.2500 mg | INTRAMUSCULAR | Status: DC | PRN
  Administered 2015-09-08 (×4): 0.5 mg via INTRAVENOUS

## 2015-09-08 MED ORDER — 0.9 % SODIUM CHLORIDE (POUR BTL) OPTIME
TOPICAL | Status: DC | PRN
  Administered 2015-09-08: 1000 mL

## 2015-09-08 MED ORDER — KETOROLAC TROMETHAMINE 30 MG/ML IJ SOLN
INTRAMUSCULAR | Status: AC
Start: 2015-09-08 — End: 2015-09-09
  Filled 2015-09-08: qty 1

## 2015-09-08 MED ORDER — ONDANSETRON HCL 4 MG/2ML IJ SOLN
INTRAMUSCULAR | Status: DC | PRN
  Administered 2015-09-08: 4 mg via INTRAVENOUS

## 2015-09-08 MED ORDER — OXYCODONE-ACETAMINOPHEN 5-325 MG PO TABS
1.0000 | ORAL_TABLET | ORAL | Status: DC | PRN
  Administered 2015-09-09: 1 via ORAL
  Filled 2015-09-08: qty 1

## 2015-09-08 MED ORDER — OXYCODONE-ACETAMINOPHEN 5-325 MG PO TABS: 1.0000 | ORAL_TABLET | ORAL | Status: DC | PRN

## 2015-09-08 MED ORDER — BUPIVACAINE-EPINEPHRINE (PF) 0.25% -1:200000 IJ SOLN
INTRAMUSCULAR | Status: AC
  Filled 2015-09-08: qty 30

## 2015-09-08 MED ORDER — GLYCOPYRROLATE 0.2 MG/ML IJ SOLN
INTRAMUSCULAR | Status: DC | PRN
  Administered 2015-09-08: 0.4 mg via INTRAVENOUS

## 2015-09-08 MED ORDER — LIDOCAINE HCL (CARDIAC) 20 MG/ML IV SOLN
INTRAVENOUS | Status: DC | PRN
  Administered 2015-09-08: 80 mg via INTRAVENOUS

## 2015-09-08 MED ORDER — FENTANYL CITRATE (PF) 100 MCG/2ML IJ SOLN
INTRAMUSCULAR | Status: DC | PRN
  Administered 2015-09-08 (×2): 100 ug via INTRAVENOUS
  Administered 2015-09-08: 150 ug via INTRAVENOUS

## 2015-09-08 MED ORDER — NEOSTIGMINE METHYLSULFATE 10 MG/10ML IV SOLN
INTRAVENOUS | Status: DC | PRN
  Administered 2015-09-08: 3 mg via INTRAVENOUS

## 2015-09-08 SURGICAL SUPPLY — 51 items
APPLIER CLIP 5 13 M/L LIGAMAX5 (MISCELLANEOUS) ×2
APR CLP MED LRG 5 ANG JAW (MISCELLANEOUS) ×1
BAG SPEC RTRVL 10 TROC 200 (ENDOMECHANICALS) ×1
BLADE SURG ROTATE 9660 (MISCELLANEOUS) IMPLANT
CANISTER SUCTION 2500CC (MISCELLANEOUS) ×2 IMPLANT
CHLORAPREP W/TINT 26ML (MISCELLANEOUS) ×2 IMPLANT
CLIP APPLIE 5 13 M/L LIGAMAX5 (MISCELLANEOUS) ×1 IMPLANT
COVER MAYO STAND STRL (DRAPES) ×2 IMPLANT
COVER SURGICAL LIGHT HANDLE (MISCELLANEOUS) ×2 IMPLANT
DRAPE C-ARM 42X72 X-RAY (DRAPES) ×2 IMPLANT
ELECT REM PT RETURN 9FT ADLT (ELECTROSURGICAL) ×2
ELECTRODE REM PT RTRN 9FT ADLT (ELECTROSURGICAL) ×1 IMPLANT
FILTER SMOKE EVAC LAPAROSHD (FILTER) IMPLANT
GLOVE BIO SURGEON STRL SZ 6 (GLOVE) ×2 IMPLANT
GLOVE BIO SURGEON STRL SZ 6.5 (GLOVE) ×2 IMPLANT
GLOVE BIO SURGEON STRL SZ7 (GLOVE) ×2 IMPLANT
GLOVE BIO SURGEON STRL SZ7.5 (GLOVE) ×2 IMPLANT
GLOVE BIO SURGEON STRL SZ8 (GLOVE) ×2 IMPLANT
GLOVE BIOGEL PI IND STRL 6 (GLOVE) ×1 IMPLANT
GLOVE BIOGEL PI IND STRL 7.0 (GLOVE) ×2 IMPLANT
GLOVE BIOGEL PI IND STRL 7.5 (GLOVE) ×1 IMPLANT
GLOVE BIOGEL PI IND STRL 8 (GLOVE) ×1 IMPLANT
GLOVE BIOGEL PI INDICATOR 6 (GLOVE) ×1
GLOVE BIOGEL PI INDICATOR 7.0 (GLOVE) ×2
GLOVE BIOGEL PI INDICATOR 7.5 (GLOVE) ×1
GLOVE BIOGEL PI INDICATOR 8 (GLOVE) ×1
GLOVE SURG SS PI 7.0 STRL IVOR (GLOVE) ×2 IMPLANT
GOWN STRL REUS W/ TWL LRG LVL3 (GOWN DISPOSABLE) ×2 IMPLANT
GOWN STRL REUS W/ TWL XL LVL3 (GOWN DISPOSABLE) ×1 IMPLANT
GOWN STRL REUS W/TWL LRG LVL3 (GOWN DISPOSABLE) ×4
GOWN STRL REUS W/TWL XL LVL3 (GOWN DISPOSABLE) ×2
KIT BASIN OR (CUSTOM PROCEDURE TRAY) ×2 IMPLANT
KIT ROOM TURNOVER OR (KITS) ×2 IMPLANT
LIQUID BAND (GAUZE/BANDAGES/DRESSINGS) ×2 IMPLANT
NEEDLE 22X1 1/2 (OR ONLY) (NEEDLE) ×2 IMPLANT
NS IRRIG 1000ML POUR BTL (IV SOLUTION) ×2 IMPLANT
PAD ARMBOARD 7.5X6 YLW CONV (MISCELLANEOUS) ×2 IMPLANT
POUCH RETRIEVAL ECOSAC 10 (ENDOMECHANICALS) ×1 IMPLANT
POUCH RETRIEVAL ECOSAC 10MM (ENDOMECHANICALS) ×1
SCISSORS LAP 5X35 DISP (ENDOMECHANICALS) ×2 IMPLANT
SET CHOLANGIOGRAPH 5 50 .035 (SET/KITS/TRAYS/PACK) ×2 IMPLANT
SET IRRIG TUBING LAPAROSCOPIC (IRRIGATION / IRRIGATOR) ×2 IMPLANT
SLEEVE ENDOPATH XCEL 5M (ENDOMECHANICALS) ×4 IMPLANT
SPECIMEN JAR SMALL (MISCELLANEOUS) ×2 IMPLANT
SUT VIC AB 4-0 PS2 27 (SUTURE) ×2 IMPLANT
TOWEL OR 17X24 6PK STRL BLUE (TOWEL DISPOSABLE) ×2 IMPLANT
TOWEL OR 17X26 10 PK STRL BLUE (TOWEL DISPOSABLE) ×2 IMPLANT
TRAY LAPAROSCOPIC MC (CUSTOM PROCEDURE TRAY) ×2 IMPLANT
TROCAR XCEL BLUNT TIP 100MML (ENDOMECHANICALS) ×2 IMPLANT
TROCAR XCEL NON-BLD 5MMX100MML (ENDOMECHANICALS) ×2 IMPLANT
TUBING INSUFFLATION (TUBING) ×2 IMPLANT

## 2015-09-08 NOTE — Op Note (Signed)
09/04/2015 - 09/08/2015  5:22 PM  PATIENT:  Emma [name]  39 y.o. female  PRE-OPERATIVE DIAGNOSIS:  biliary dyskinesia  POST-OPERATIVE DIAGNOSIS:  biliary dyskinesia  PROCEDURE:  Procedure(s): LAPAROSCOPIC CHOLECYSTECTOMY  SURGEON:  Surgeon(s): Violeta Gelinas, MD  ASSISTANTS: Magnus Ivan, RNFA   ANESTHESIA:   local and general  EBL:  Total I/O In: 60 [P.O.:60] Out: -   BLOOD ADMINISTERED:none  DRAINS: none   SPECIMEN:  Excision  DISPOSITION OF SPECIMEN:  PATHOLOGY  COUNTS:  YES  DICTATION: .Dragon Dictation [name] presents for cholecystectomy. Informed consent was obtained. She is on intravenous antibiotics. She was brought the operating room and general endotracheal anesthesia was administered by the anesthesia staff. Her abdomen was prepped and draped in sterile fashion. Timeout procedure was performed.The infraumbilical region was infiltrated with local. Infraumbilical incision was made. Subcutaneous tissues were dissected down revealing the anterior fascia. This was divided sharply along the midline. Peritoneal cavity was entered under direct vision without complication. A 0 Vicryl pursestring was placed around the fascial opening. Hassan trocar was inserted into the abdomen. The abdomen was insufflated with carbon dioxide in standard fashion. Under direct vision a 5 mm epigastric and 5 mm right lateral ports 2 were placed. Local was used at each port site. Laparoscopic exploration revealed a somewhat distended gallbladder. The dome was retracted superior medially. The infundibulum was retracted inferior laterally. Dissection began laterally and progressed medially easily identifying the cystic duct. Dissection continued until critical view was obtained between the cystic duct, the infundibulum, and the liver. Once we had excellent visualization, 3 clips were placed proximally on the cystic duct and one was placed distally and it was divided. Further dissection revealed an  anterior and posterior branch of the cystic artery. These were both clipped twice proximally and divided. Gallbladder was taken off the liver bed with Bovie cautery. It was placed in a bag and removed from the abdomen via the infraumbilical port site. The liver bed was copiously irrigated and hemostasis was obtained. Clips remain in excellent position. Irrigation fluid returned clear. Ports removed under direct vision. Pneumoperitoneum was released. Infraumbilical fascia was closed by tying the pursestring. All 4 wounds were copiously irrigated and the skin was closed with subcuticular Vicryl. Dermabond was placed. All counts were correct. Patient tolerated procedure well without apparent complication was taken recovery in stable condition.  PATIENT DISPOSITION:  PACU - hemodynamically stable.   Delay start of Pharmacological VTE agent (>24hrs) due to surgical blood loss or risk of bleeding:  no  Violeta Gelinas, MD, MPH, FACS Pager: [phone]  9/9/20165:22 PM

## 2015-09-08 NOTE — Progress Notes (Signed)
[facility] PROGRESS NOTE  Emma [name]:[id number] DOB: 1976/12/19 DOA: 09/04/2015 PCP: REDMON,NOELLE, PA-C  Brief Summary  Emma [name] is a 39 y.o. female with history of hypothyroidism and anxiety and depression presents to the ER because of abdominal pain and nausea vomiting. Patient has been having these symptoms since last evening. Patient states the pain is mostly in the right upper quadrant epigastric area. Patient has had multiple episodes of nausea and vomiting. Patient was given at least a dose of Dilaudid for pain relief. Patient has no symptoms on August 10 and July 25 of this year. CT scan of the abdomen done at that time in July was showing pericholecystic fluid which is followed by a normal HIDA scan. At this time abdominal sonogram in the ER does not show anything acute. Labs are also not showing anything acute. Patient had followed up with Dr. Evette Cristal, gastroenterologist for these complaints recently. On-call gastroenterologist Dr. Bosie Clos was consulted by the ER physician and they'll be seeing patient in consult. Patient has been admitted for further observation. Denies any diarrhea fever chills.  Assessment/Plan  Abdominal pain with nausea and vomiting, RUQ pain without evidence of gallstones on previous imaging.  Negative HIDA scan without EF on 07/24/2015.  Concern for possible biliary dyskinesia.   -  EGD today:  No explanation for symptoms.  Small hiatal hernia -  HIDA CCK abnormal with only 20% emptying -  Appreciate GI and general surgery assistance -  To OR later today for lap chole  Hypothyroidism, stable, continue levothyroxine  Anxiety and depression, stable, continue Effexor-XR and xanax prn  Elevated blood pressures likely due to pain and anxiety from upcoming surgery -  Treat pain  Diet:  regular Access:  PIV IVF:  off Proph:  SCDs  Code Status: full Family Communication: patient alone Disposition Plan: pending decision about  surgery   Consultants:  Gastroenterology, Dr. Ewing Schlein  General surgery, Dr. Violeta Gelinas  Procedures:  EGD on 9/7  HIDA with CCK on 9/8  Antibiotics:  none   HPI/Subjective:  Pain coming back this morning but had received less pain medication since last night with less pain   Objective: Filed Vitals:   09/07/15 1330 09/07/15 2212 09/08/15 0448 09/08/15 0905  BP: 123/56 145/80 118/67 142/59  Pulse: 78 71 73 69  Temp: 98.2 F (36.8 C) 98.6 F (37 C) 98 F (36.7 C) 98 F (36.7 C)  TempSrc: Oral Oral Oral Oral  Resp: Height:      Weight:      SpO2: 92% 95% 95% 96%    Intake/Output Summary (Last 24 hours) at 09/08/15 1458 Last data filed at 09/08/15 1211  Gross per 24 hour  Intake    300 ml  Output      0 ml  Net    300 ml   Filed Weights   09/05/15 0453 09/06/15 0736  Weight: 113.4 kg (250 lb) 113.399 kg (250 lb)   Body mass index is 45.71 kg/(m^2).  Exam:   General:  Obese female, No acute distress  HEENT:  NCAT, MMM  Cardiovascular:  RRR, nl S1, S2 no mrg, 2+ pulses, warm extremities  Respiratory:  CTAB, no increased WOB  Abdomen:   NABS, soft, minimal TTP in the RUQ without rebound or guarding  MSK:   Normal tone and bulk, no LEE  Neuro:  Grossly intact  Data Reviewed: Basic Metabolic Panel:  Recent Labs Lab 09/05/15 09/05/15 0609  NA 137  139  K 3.8 3.9  CL 102 104  CO2 26 27  GLUCOSE 126* 106*  BUN 11 8  CREATININE 0.78 0.79  CALCIUM 8.7* 8.4*   Liver Function Tests:  Recent Labs Lab 09/05/15 09/05/15 0609  AST 20 17  ALT 14 14  ALKPHOS 57 48  BILITOT 0.4 0.2*  PROT 6.1* 6.0*  ALBUMIN 3.2* 2.9*    Recent Labs Lab 09/05/15 09/05/15 0609  LIPASE 48 23   No results for input(s): AMMONIA in the last 168 hours. CBC:  Recent Labs Lab 09/05/15 09/05/15 0609  WBC 8.3 9.4  NEUTROABS  --  6.5  HGB 12.4 11.1*  HCT 37.8 35.2*  MCV 96.4 96.2  PLT 353 298    Recent Results (from the past 240 hour(s))   Surgical pcr screen     Status: None   Collection Time: 09/07/15  5:24 PM  Result Value Ref Range Status   MRSA, PCR NEGATIVE NEGATIVE Final   Staphylococcus aureus NEGATIVE NEGATIVE Final    Comment:        The Xpert SA Assay (FDA approved for NASAL specimens in patients over 63 years of age), is one component of a comprehensive surveillance program.  Test performance has been validated by [facility] for patients greater than or equal to 34 year old. It is not intended to diagnose infection nor to guide or monitor treatment.      Studies: Nm Hepato W/eject Fract  09/07/2015   CLINICAL DATA:  Generalized abdominal pain, RIGHT upper quadrant pain with nausea and vomiting for past 2 months  EXAM: NUCLEAR MEDICINE HEPATOBILIARY IMAGING WITH GALLBLADDER EF  TECHNIQUE: Sequential images of the abdomen were obtained out to 60 minutes following intravenous administration of radiopharmaceutical. After slow intravenous infusion of 2.27 micrograms Cholecystokinin, gallbladder ejection fraction was determined.  RADIOPHARMACEUTICALS:  5 mCi Tc-76m Choletec IV  COMPARISON:  07/24/2015  FINDINGS: Normal tracer extraction from bloodstream indicating normal hepatocellular function.  Normal excretion of tracer into biliary tree.  Gallbladder visualized at 34 min.  Gallbladder tracer accumulation confirmed with RIGHT lateral view.  Small bowel visualized at 12 min.  No hepatic retention of tracer.  Subjectively decreased emptying of tracer from gallbladder following CCK administration.  Calculated gallbladder ejection fraction is 20%, abnormally low.  Patient reported no symptoms following CCK administration.  IMPRESSION: Patent biliary tree.  Abnormal gallbladder response to CCK stimulation with a decreased gallbladder ejection fraction of 20%.   Electronically Signed   By: Ulyses Southward M.D.   On: 09/07/2015 13:40    Scheduled Meds: . [facility] Hold] antiseptic oral rinse  7 mL Mouth Rinse q12n4p  .  cefTRIAXone (ROCEPHIN)  IV  2 g Intravenous On Call to OR  . [MAR Hold] chlorhexidine  15 mL Mouth Rinse BID  . [MAR Hold] levothyroxine  75 mcg Oral QAC breakfast  . [MAR Hold] pantoprazole (PROTONIX) IV  40 mg Intravenous Q12H  . [MAR Hold] venlafaxine XR  150 mg Oral Q breakfast   Continuous Infusions: . lactated ringers      Active Problems:   Abdominal pain   Hypothyroidism    Time spent: 30 min    Birttany Dechellis  [facility] Pager [phone]. If 7PM-7AM, please contact night-coverage at [email or web address], password [facility] 09/08/2015, 2:58 PM

## 2015-09-08 NOTE — Progress Notes (Signed)
Called Dr Ivin Booty to evaluate congested sounding cough. No stridor, normal oxygen saturation. Pt with irritated airway, ok to discharge from PACU

## 2015-09-08 NOTE — Anesthesia Postprocedure Evaluation (Signed)
  Anesthesia Post-op Note  Patient: Emma [name]  Procedure(s) Performed: Procedure(s): LAPAROSCOPIC CHOLECYSTECTOMY (N/A)  Patient Location: PACU  Anesthesia Type: General   Level of Consciousness: awake, alert  and oriented  Airway and Oxygen Therapy: Patient Spontanous Breathing  Post-op Pain: mild  Post-op Assessment: Post-op Vital signs reviewed  Post-op Vital Signs: Reviewed  Last Vitals:  Filed Vitals:   09/08/15 1735  BP: 160/81  Pulse: 91  Temp: 36.8 C  Resp: 14    Complications: No apparent anesthesia complications

## 2015-09-08 NOTE — Progress Notes (Signed)
Patient ID: Emma [name], female   DOB: Sep 09, 1976, 39 y.o.   MRN: [id number] Ready for OR today.  No pain this morning.  No questions.  Continue NPO and plan for lap chole today.  Ludger Bones E 8:02 AM 09/08/2015

## 2015-09-08 NOTE — Transfer of Care (Signed)
Immediate Anesthesia Transfer of Care Note  Patient: Emma [name]  Procedure(s) Performed: Procedure(s): LAPAROSCOPIC CHOLECYSTECTOMY (N/A)  Patient Location: PACU  Anesthesia Type:General  Level of Consciousness: awake  Airway & Oxygen Therapy: Patient Spontanous Breathing and Patient connected to face mask oxygen  Post-op Assessment: Report given to RN and Post -op Vital signs reviewed and stable  Post vital signs: Reviewed and stable  Last Vitals:  Filed Vitals:   09/08/15 0905  BP: 142/59  Pulse: 69  Temp: 36.7 C  Resp: 17    Complications: No apparent anesthesia complications

## 2015-09-08 NOTE — Anesthesia Preprocedure Evaluation (Addendum)
Anesthesia Evaluation  Patient identified by MRN, date of birth, ID band Patient awake    Reviewed: Allergy & Precautions, NPO status , Patient's Chart, lab work & pertinent test results  Airway Mallampati: II  TM Distance: >3 FB Neck ROM: Full    Dental no notable dental hx.    Pulmonary Current Smoker,    Pulmonary exam normal breath sounds clear to auscultation       Cardiovascular + Peripheral Vascular Disease  Normal cardiovascular exam Rhythm:Regular Rate:Normal     Neuro/Psych PSYCHIATRIC DISORDERS Depression negative neurological ROS     GI/Hepatic negative GI ROS, Neg liver ROS,   Endo/Other  Hypothyroidism Morbid obesity  Renal/GU negative Renal ROS     Musculoskeletal negative musculoskeletal ROS (+)   Abdominal   Peds  Hematology negative hematology ROS (+)   Anesthesia Other Findings   Reproductive/Obstetrics                            Anesthesia Physical Anesthesia Plan  ASA: III  Anesthesia Plan: General   Post-op Pain Management:    Induction: Intravenous  Airway Management Planned: Oral ETT  Additional Equipment: None  Intra-op Plan:   Post-operative Plan: Extubation in OR  Informed Consent: I have reviewed the patients History and Physical, chart, labs and discussed the procedure including the risks, benefits and alternatives for the proposed anesthesia with the patient or authorized representative who has indicated his/her understanding and acceptance.   Dental advisory given  Plan Discussed with: CRNA  Anesthesia Plan Comments:         Anesthesia Quick Evaluation

## 2015-09-08 NOTE — Progress Notes (Signed)
2 Days Post-Op  Subjective: No new complaints  Objective: Vital signs in last 24 hours: Temp:  [98 F (36.7 C)-98.6 F (37 C)] 98 F (36.7 C) (09/09 0448) Pulse Rate:  [71-78] 73 (09/09 0448) Resp:  [16-18] 18 (09/09 0448) BP: (118-145)/(56-80) 118/67 mmHg (09/09 0448) SpO2:  [92 %-95 %] 95 % (09/09 0448) Last BM Date: 09/04/15  Intake/Output from previous day: 09/08 0701 - 09/09 0700 In: 240 [P.O.:240] Out: -  Intake/Output this shift:    General appearance: alert and cooperative Resp: clear to auscultation bilaterally Cardio: regular rate and rhythm GI: soft, NT  Lab Results:  No results for input(s): WBC, HGB, HCT, PLT in the last 72 hours. BMET No results for input(s): NA, K, CL, CO2, GLUCOSE, BUN, CREATININE, CALCIUM in the last 72 hours. PT/INR No results for input(s): LABPROT, INR in the last 72 hours. ABG No results for input(s): PHART, HCO3 in the last 72 hours.  Invalid input(s): PCO2, PO2  Studies/Results: Nm Hepato W/eject Fract  09/07/2015   CLINICAL DATA:  Generalized abdominal pain, RIGHT upper quadrant pain with nausea and vomiting for past 2 months  EXAM: NUCLEAR MEDICINE HEPATOBILIARY IMAGING WITH GALLBLADDER EF  TECHNIQUE: Sequential images of the abdomen were obtained out to 60 minutes following intravenous administration of radiopharmaceutical. After slow intravenous infusion of 2.27 micrograms Cholecystokinin, gallbladder ejection fraction was determined.  RADIOPHARMACEUTICALS:  5 mCi Tc-39m Choletec IV  COMPARISON:  07/24/2015  FINDINGS: Normal tracer extraction from bloodstream indicating normal hepatocellular function.  Normal excretion of tracer into biliary tree.  Gallbladder visualized at 34 min.  Gallbladder tracer accumulation confirmed with RIGHT lateral view.  Small bowel visualized at 12 min.  No hepatic retention of tracer.  Subjectively decreased emptying of tracer from gallbladder following CCK administration.  Calculated gallbladder  ejection fraction is 20%, abnormally low.  Patient reported no symptoms following CCK administration.  IMPRESSION: Patent biliary tree.  Abnormal gallbladder response to CCK stimulation with a decreased gallbladder ejection fraction of 20%.   Electronically Signed   By: Ulyses Southward M.D.   On: 09/07/2015 13:40    Anti-infectives: Anti-infectives    Start     Dose/Rate Route Frequency Ordered Stop   09/08/15 0600  cefTRIAXone (ROCEPHIN) 2 g in dextrose 5 % 50 mL IVPB     2 g 100 mL/hr over 30 Minutes Intravenous On call to O.R. 09/07/15 1534 09/09/15 0559      Assessment/Plan: Biliary dyskinesia - for lap chole today. I added possible cholangiogram to the consent and also D/W her. I discussed the procedure, risks, benefits, and expected post-op course with her and she agrees.     Emma [name] E 09/08/2015

## 2015-09-08 NOTE — Discharge Instructions (Signed)
[facility] ______[facility] °LAPAROSCOPIC SURGERY: POST OP INSTRUCTIONS °Always review your discharge instruction sheet given to you by the facility where your surgery was performed. °IF YOU HAVE DISABILITY OR FAMILY LEAVE FORMS, YOU MUST BRING THEM TO THE OFFICE FOR PROCESSING.   °DO NOT GIVE THEM TO YOUR DOCTOR. ° °1. A prescription for pain medication may be given to you upon discharge.  Take your pain medication as prescribed, if needed.  If narcotic pain medicine is not needed, then you may take acetaminophen (Tylenol) or ibuprofen (Advil) as needed. °2. Take your usually prescribed medications unless otherwise directed. °3. If you need a refill on your pain medication, please contact your pharmacy.  They will contact our office to request authorization. Prescriptions will not be filled after 5pm or on week-ends. °4. You should follow a light diet the first few days after arrival home, such as soup and crackers, etc.  Be sure to include lots of fluids daily. °5. Most patients will experience some swelling and bruising in the area of the incisions.  Ice packs will help.  Swelling and bruising can take several days to resolve.  °6. It is common to experience some constipation if taking pain medication after surgery.  Increasing fluid intake and taking a stool softener (such as Colace) will usually help or prevent this problem from occurring.  A mild laxative (Milk of Magnesia or Miralax) should be taken according to package instructions if there are no bowel movements after 48 hours. °7. Unless discharge instructions indicate otherwise, you may remove your bandages 24-48 hours after surgery, and you may shower at that time.  You may have steri-strips (small skin tapes) in place directly over the incision.  These strips should be left on the skin for 7-10 days.  If your surgeon used skin glue on the incision, you may shower in 24 hours.  The glue will flake off over the next 2-3 weeks.  Any sutures or  staples will be removed at the office during your follow-up visit. °8. ACTIVITIES:  You may resume regular (light) daily activities beginning the next day--such as daily self-care, walking, climbing stairs--gradually increasing activities as tolerated.  You may have sexual intercourse when it is comfortable.  Refrain from any heavy lifting or straining until approved by your doctor. °a. You may drive when you are no longer taking prescription pain medication, you can comfortably wear a seatbelt, and you can safely maneuver your car and apply brakes. °b. RETURN TO WORK:  __________________________________________________________ °9. You should see your doctor in the office for a follow-up appointment approximately 2-3 weeks after your surgery.  Make sure that you call for this appointment within a day or two after you arrive home to insure a convenient appointment time. °10. OTHER INSTRUCTIONS: __________________________________________________________________________________________________________________________ __________________________________________________________________________________________________________________________ °WHEN TO CALL YOUR DOCTOR: °1. Fever over 101.0 °2. Inability to urinate °3. Continued bleeding from incision. °4. Increased pain, redness, or drainage from the incision. °5. Increasing abdominal pain ° °The clinic staff is available to answer your questions during regular business hours.  Please don’t hesitate to call and ask to speak to one of the nurses for clinical concerns.  If you have a medical emergency, go to the nearest emergency room or call 911.  A surgeon from [facility] is always on call at the hospital. °[address], [address], [place], [place]  [address] ? [address] [address], [place], [place]   [address] °[phone] ? [phone] ? FAX [phone] °Web site:   [email or web address] °

## 2015-09-08 NOTE — Anesthesia Procedure Notes (Signed)
Procedure Name: Intubation Date/Time: 09/08/2015 4:31 PM Performed by: Gavin Pound, Sidonie Dexheimer J Pre-anesthesia Checklist: Patient identified, Timeout performed, Emergency Drugs available, Suction available and Patient being monitored Patient Re-evaluated:Patient Re-evaluated prior to inductionOxygen Delivery Method: Circle system utilized Preoxygenation: Pre-oxygenation with 100% oxygen Intubation Type: IV induction Ventilation: Mask ventilation without difficulty Laryngoscope Size: Mac and 3 Grade View: Grade I Tube type: Oral Tube size: 7.0 mm Number of attempts: 1 Placement Confirmation: ETT inserted through vocal cords under direct vision,  positive ETCO2 and breath sounds checked- equal and bilateral Secured at: 21 cm Tube secured with: Tape Dental Injury: Teeth and Oropharynx as per pre-operative assessment

## 2015-09-09 DIAGNOSIS — K828 Other specified diseases of gallbladder: Secondary | ICD-10-CM

## 2015-09-09 DIAGNOSIS — F411 Generalized anxiety disorder: Secondary | ICD-10-CM | POA: Diagnosis not present

## 2015-09-09 DIAGNOSIS — IMO0001 Reserved for inherently not codable concepts without codable children: Secondary | ICD-10-CM

## 2015-09-09 DIAGNOSIS — R030 Elevated blood-pressure reading, without diagnosis of hypertension: Secondary | ICD-10-CM | POA: Diagnosis not present

## 2015-09-09 DIAGNOSIS — E039 Hypothyroidism, unspecified: Secondary | ICD-10-CM | POA: Diagnosis not present

## 2015-09-09 MED ORDER — OXYCODONE-ACETAMINOPHEN 5-325 MG PO TABS: 1.0000 | ORAL_TABLET | ORAL | Status: AC | PRN

## 2015-09-09 MED ORDER — OXYCODONE-ACETAMINOPHEN 5-325 MG PO TABS
1.0000 | ORAL_TABLET | ORAL | Status: DC | PRN
  Administered 2015-09-09 (×2): 2 via ORAL
  Filled 2015-09-09 (×2): qty 2

## 2015-09-09 MED ORDER — ONDANSETRON HCL 4 MG PO TABS: 4.0000 mg | ORAL_TABLET | Freq: Four times a day (QID) | ORAL | Status: AC | PRN

## 2015-09-09 NOTE — Progress Notes (Signed)
Discharge instructions reviewed with patient. All questions answered at this time. Awaiting for transport from family.  Kaysie Michelini, RN 

## 2015-09-09 NOTE — Progress Notes (Signed)
1 Day Post-Op  Subjective: Having moderate post op pain but wants to go home today No nausea currently  Objective: Vital signs in last 24 hours: Temp:  [97.7 F (36.5 C)-98.4 F (36.9 C)] 98.4 F (36.9 C) (09/10 0523) Pulse Rate:  [69-98] 86 (09/10 0523) Resp:  [12-17] 16 (09/10 0523) BP: (103-160)/(45-81) 107/59 mmHg (09/10 0523) SpO2:  [91 %-100 %] 97 % (09/10 0523) Last BM Date: 09/06/15  Intake/Output from previous day: 09/09 0701 - 09/10 0700 In: 1740 [P.O.:540; I.V.:1200] Out: 20 [Blood:20] Intake/Output this shift:    Abdomen soft, appropriately tender, incisions clean  Lab Results:  No results for input(s): WBC, HGB, HCT, PLT in the last 72 hours. BMET No results for input(s): NA, K, CL, CO2, GLUCOSE, BUN, CREATININE, CALCIUM in the last 72 hours. PT/INR No results for input(s): LABPROT, INR in the last 72 hours. ABG No results for input(s): PHART, HCO3 in the last 72 hours.  Invalid input(s): PCO2, PO2  Studies/Results: Nm Hepato W/eject Fract  09/07/2015   CLINICAL DATA:  Generalized abdominal pain, RIGHT upper quadrant pain with nausea and vomiting for past 2 months  EXAM: NUCLEAR MEDICINE HEPATOBILIARY IMAGING WITH GALLBLADDER EF  TECHNIQUE: Sequential images of the abdomen were obtained out to 60 minutes following intravenous administration of radiopharmaceutical. After slow intravenous infusion of 2.27 micrograms Cholecystokinin, gallbladder ejection fraction was determined.  RADIOPHARMACEUTICALS:  5 mCi Tc-19m Choletec IV  COMPARISON:  07/24/2015  FINDINGS: Normal tracer extraction from bloodstream indicating normal hepatocellular function.  Normal excretion of tracer into biliary tree.  Gallbladder visualized at 34 min.  Gallbladder tracer accumulation confirmed with RIGHT lateral view.  Small bowel visualized at 12 min.  No hepatic retention of tracer.  Subjectively decreased emptying of tracer from gallbladder following CCK administration.  Calculated  gallbladder ejection fraction is 20%, abnormally low.  Patient reported no symptoms following CCK administration.  IMPRESSION: Patent biliary tree.  Abnormal gallbladder response to CCK stimulation with a decreased gallbladder ejection fraction of 20%.   Electronically Signed   By: Ulyses Southward M.D.   On: 09/07/2015 13:40    Anti-infectives: Anti-infectives    Start     Dose/Rate Route Frequency Ordered Stop   09/08/15 0600  cefTRIAXone (ROCEPHIN) 2 g in dextrose 5 % 50 mL IVPB     2 g 100 mL/hr over 30 Minutes Intravenous On call to O.R. 09/07/15 1534 09/08/15 1619      Assessment/Plan: s/p Procedure(s): LAPAROSCOPIC CHOLECYSTECTOMY (N/A)  Stable post op Ok for discharge from General Surgery standpoint     Senon Nixon A 09/09/2015

## 2015-09-09 NOTE — Discharge Summary (Signed)
Physician Discharge Summary  VIVIANN BROYLES ZOX:[id number] DOB: July 14, 1976 DOA: 09/04/2015  PCP: REDMON,NOELLE, PA-C  Admit date: 09/04/2015 Discharge date: 09/09/2015  Recommendations for Outpatient Follow-up:  1. Follow-up with general surgery in 2 weeks and if abdominal pain continues, consider referral back to GI for MRCP 2. F/u with PCP for ongoing pain management and blood pressure check in 1-2 weeks  Discharge Diagnoses:  Principal Problem:   Biliary dyskinesia Active Problems:   Abdominal pain   Hypothyroidism   Elevated blood pressure   Anxiety state   Discharge Condition: Stable, improved  Diet recommendation: Healthy heart  Wt Readings from Last 3 Encounters:  09/06/15 113.399 kg (250 lb)  06/04/12 87.091 kg (192 lb)  12/20/11 87.635 kg (193 lb 3.2 oz)    History of present illness:  Emma [name] is a 39 y.o. female with history of hypothyroidism and anxiety and depression presents to the ER because of abdominal pain and nausea vomiting. Patient has been having these symptoms since last evening. Patient states the pain is mostly in the right upper quadrant epigastric area. Patient has had multiple episodes of nausea and vomiting. Patient was given at least a dose of Dilaudid for pain relief. Patient has no symptoms on August 10 and July 25 of this year. CT scan of the abdomen done at that time in July was showing pericholecystic fluid which is followed by a normal HIDA scan. At this time abdominal sonogram in the ER does not show anything acute. Labs are also not showing anything acute. Patient had followed up with Dr. Evette Cristal, gastroenterologist for these complaints recently. On-call gastroenterologist Dr. Bosie Clos was consulted by the ER physician and they'll be seeing patient in consult. Patient has been admitted for further observation. Denies any diarrhea fever chills.  Hospital Course:   Possible biliary dyskinesia. The patient presented with abdominal pain in the  right upper quadrant, nausea and vomiting. She had no evidence of gallstones on imaging and had a negative HIDA scan on 07/24/2015. She was seen by gastroenterology who performed EGD which demonstrate only a small hiatal hernia without evidence of gastritis or ulcers. She underwent a HIDA with ejection fraction which was abnormal with only 20% emptying. Although she only had 20% emptying, she also did not have reproduction of her symptoms with administration of CCK. The risks and benefits of cholecystectomy were discussed with her by general surgery and she decided to undergo laparoscopic cholecystectomy which was performed on 09/08/2015 without complication. Postoperatively, she was continuing to have some abdominal pain as expected, however she was able to tolerate a regular diet and was able to ambulate. She was given prescriptions for nausea medication and pain medication and is advised to follow-up with the general surgeons for routine examination in approximately 1-2 weeks. She should call the office on Monday to schedule an appointment.  If she continues to have abdominal pain, she should follow-up with gastroenterology for possible MRCP.   Hypothyroidism, stable, continued levothyroxine.  Anxiety and depression, stable, continued Effexor and Xanax when necessary.  Elevated blood pressures were likely secondary to pain and anxiety and trended down postoperatively.  Consultants:  Gastroenterology, Dr. Ewing Schlein  General surgery, Dr. Violeta Gelinas  Procedures:  EGD on 9/7  HIDA with CCK on 9/8  Laparoscopic cholecystectomy on 9/9  Antibiotics:  none  Discharge Exam: Filed Vitals:   09/09/15 0937  BP: 106/73  Pulse: 95  Temp: 98.1 F (36.7 C)  Resp: 18   Filed Vitals:   09/08/15 2150  09/09/15 0113 09/09/15 0523 09/09/15 0937  BP: 122/58 128/71 107/59 106/73  Pulse: 93 81 86 95  Temp: 97.7 F (36.5 C) 98.3 F (36.8 C) 98.4 F (36.9 C) 98.1 F (36.7 C)  TempSrc: Oral Oral Oral  Oral  Resp: 16 16 16 18   Height:      Weight:      SpO2: 95% 91% 97% 95%     General: Obese female, No acute distress  HEENT: NCAT, MMM  Cardiovascular: RRR, nl S1, S2 no mrg, 2+ pulses, warm extremities  Respiratory: CTAB, no increased WOB  Abdomen: NABS, soft, persistently tender in the right upper quadrant without rebound or guarding. Incisional sites appear clean, dry, nonindurated, no erythema or discharge  MSK: Normal tone and bulk, no LEE  Neuro: Grossly intact  Discharge Instructions      Discharge Instructions    Call MD for:  difficulty breathing, headache or visual disturbances    Complete by:  As directed      Call MD for:  extreme fatigue    Complete by:  As directed      Call MD for:  hives    Complete by:  As directed      Call MD for:  persistant dizziness or light-headedness    Complete by:  As directed      Call MD for:  persistant nausea and vomiting    Complete by:  As directed      Call MD for:  redness, tenderness, or signs of infection (pain, swelling, redness, odor or green/yellow discharge around incision site)    Complete by:  As directed      Call MD for:  severe uncontrolled pain    Complete by:  As directed      Call MD for:  temperature >100.4    Complete by:  As directed      Diet - low sodium heart healthy    Complete by:  As directed      Increase activity slowly    Complete by:  As directed             Medication List    TAKE these medications        ALPRAZolam 0.25 MG tablet  Commonly known as:  XANAX  Take 0.25 mg by mouth 2 (two) times daily as needed for anxiety.     diphenhydramine-acetaminophen 25-500 MG Tabs  Commonly known as:  TYLENOL PM  Take 1 tablet by mouth at bedtime as needed.     ENPRESSE-28 PO  Take 1 tablet by mouth daily.     ibuprofen 200 MG tablet  Commonly known as:  ADVIL,MOTRIN  Take 400 mg by mouth every 6 (six) hours as needed for moderate pain.     levothyroxine 75 MCG tablet   Commonly known as:  SYNTHROID, LEVOTHROID  Take 75 mcg by mouth daily before breakfast.     ondansetron 4 MG tablet  Commonly known as:  ZOFRAN  Take 1 tablet (4 mg total) by mouth every 6 (six) hours as needed for nausea.     oxyCODONE-acetaminophen 5-325 MG per tablet  Commonly known as:  PERCOCET/ROXICET  Take 1-2 tablets by mouth every 4 (four) hours as needed for moderate pain.     venlafaxine XR 150 MG 24 hr capsule  Commonly known as:  EFFEXOR-XR  Take 150 mg by mouth daily with breakfast.       Follow-up Information    Follow up with [facility]  On 09/26/2015.   Specialty:  General Surgery   Why:  [facility], 3:30pm, arrive no later than 3:00pm for paperwork   Contact information:   [address] [place] [address] [phone]       Follow up with REDMON,NOELLE, PA-C. Schedule an appointment as soon as possible for a visit in 1 week.   Specialty:  Nurse Practitioner   Contact information:   [address] [place] [address] [phone]       Schedule an appointment as soon as possible for a visit with Gwenevere Abbot, MD.   Specialty:  Gastroenterology   Why:  As needed   Contact information:   [address] [place] [address] [phone]        The results of significant diagnostics from this hospitalization (including imaging, microbiology, ancillary and laboratory) are listed below for reference.    Significant Diagnostic Studies: Nm Hepato W/eject Fract  09/07/2015   CLINICAL DATA:  Generalized abdominal pain, RIGHT upper quadrant pain with nausea and vomiting for past 2 months  EXAM: NUCLEAR MEDICINE HEPATOBILIARY IMAGING WITH GALLBLADDER EF  TECHNIQUE: Sequential images of the abdomen were obtained out to 60 minutes following intravenous administration of radiopharmaceutical. After slow intravenous infusion of 2.27 micrograms Cholecystokinin, gallbladder ejection fraction was  determined.  RADIOPHARMACEUTICALS:  5 mCi Tc-21m Choletec IV  COMPARISON:  07/24/2015  FINDINGS: Normal tracer extraction from bloodstream indicating normal hepatocellular function.  Normal excretion of tracer into biliary tree.  Gallbladder visualized at 34 min.  Gallbladder tracer accumulation confirmed with RIGHT lateral view.  Small bowel visualized at 12 min.  No hepatic retention of tracer.  Subjectively decreased emptying of tracer from gallbladder following CCK administration.  Calculated gallbladder ejection fraction is 20%, abnormally low.  Patient reported no symptoms following CCK administration.  IMPRESSION: Patent biliary tree.  Abnormal gallbladder response to CCK stimulation with a decreased gallbladder ejection fraction of 20%.   Electronically Signed   By: Ulyses Southward M.D.   On: 09/07/2015 13:40   Dg Abd Acute W/chest  09/05/2015   CLINICAL DATA:  Right upper quadrant pain on off for 2 months but worse tonight.  EXAM: DG ABDOMEN ACUTE W/ 1V CHEST  COMPARISON:  CT abdomen and pelvis 07/24/2015  FINDINGS: Normal heart size and pulmonary vascularity. No focal airspace disease or consolidation in the lungs. No blunting of costophrenic angles. No pneumothorax. Mediastinal contours appear intact.  Scattered gas and stool in the colon. No small or large bowel distention. No free intra-abdominal air. No abnormal air-fluid levels. No radiopaque stones. Visualized bones appear intact.  IMPRESSION: No evidence of active pulmonary disease. Nonobstructive bowel gas pattern.   Electronically Signed   By: Burman Nieves M.D.   On: 09/05/2015 04:28   US Abdomen Limited Ruq  09/05/2015   CLINICAL DATA:  39 year old female with right upper quadrant abdominal pain.  EXAM: US ABDOMEN LIMITED - RIGHT UPPER QUADRANT  COMPARISON:  CT, ultrasound, and Hepatobiliary scintigraphy dated 07/24/2015  FINDINGS: Gallbladder:  No gallstones or wall thickening visualized. No sonographic Murphy sign noted.  Common bile duct:   Diameter: 4 mm  Liver:  There is mild diffuse increase in hepatic echogenicity compatible with fatty infiltration.  IMPRESSION: No gallstone.   Electronically Signed   By: Elgie Collard M.D.   On: 09/05/2015 02:21    Microbiology: Recent Results (from the past 240 hour(s))  Surgical pcr screen  Status: None   Collection Time: 09/07/15  5:24 PM  Result Value Ref Range Status   MRSA, PCR NEGATIVE NEGATIVE Final   Staphylococcus aureus NEGATIVE NEGATIVE Final    Comment:        The Xpert SA Assay (FDA approved for NASAL specimens in patients over 74 years of age), is one component of a comprehensive surveillance program.  Test performance has been validated by [facility] for patients greater than or equal to 13 year old. It is not intended to diagnose infection nor to guide or monitor treatment.      Labs: Basic Metabolic Panel:  Recent Labs Lab 09/05/15 09/05/15 0609  NA 137 139  K 3.8 3.9  CL 102 104  CO2 26 27  GLUCOSE 126* 106*  BUN 11 8  CREATININE 0.78 0.79  CALCIUM 8.7* 8.4*   Liver Function Tests:  Recent Labs Lab 09/05/15 09/05/15 0609  AST 20 17  ALT 14 14  ALKPHOS 57 48  BILITOT 0.4 0.2*  PROT 6.1* 6.0*  ALBUMIN 3.2* 2.9*    Recent Labs Lab 09/05/15 09/05/15 0609  LIPASE 48 23   No results for input(s): AMMONIA in the last 168 hours. CBC:  Recent Labs Lab 09/05/15 09/05/15 0609  WBC 8.3 9.4  NEUTROABS  --  6.5  HGB 12.4 11.1*  HCT 37.8 35.2*  MCV 96.4 96.2  PLT 353 298   Cardiac Enzymes: No results for input(s): CKTOTAL, CKMB, CKMBINDEX, TROPONINI in the last 168 hours. BNP: BNP (last 3 results) No results for input(s): BNP in the last 8760 hours.  ProBNP (last 3 results) No results for input(s): PROBNP in the last 8760 hours.  CBG: No results for input(s): GLUCAP in the last 168 hours.  Time coordinating discharge: 35 minutes  Signed:  Alicianna Litchford  [facility] 09/09/2015, 11:57 AM   \

## 2015-09-11 ENCOUNTER — Encounter (HOSPITAL_COMMUNITY): Payer: Self-pay | Admitting: General Surgery

## 2015-10-03 ENCOUNTER — Other Ambulatory Visit (HOSPITAL_COMMUNITY)
Admission: RE | Admit: 2015-10-03 | Discharge: 2015-10-03 | Disposition: A | Discharge status: Home / Self Care | Payer: 59 | Source: Ambulatory Visit | Attending: Family Medicine | Admitting: Family Medicine

## 2015-10-03 ENCOUNTER — Other Ambulatory Visit: Payer: Self-pay | Admitting: Physician Assistant

## 2015-10-03 DIAGNOSIS — Z124 Encounter for screening for malignant neoplasm of cervix: Secondary | ICD-10-CM | POA: Insufficient documentation

## 2015-10-05 LAB — CYTOLOGY - PAP

## 2016-03-27 ENCOUNTER — Ambulatory Visit: Payer: Self-pay

## 2016-06-03 DIAGNOSIS — E039 Hypothyroidism, unspecified: Secondary | ICD-10-CM | POA: Diagnosis not present

## 2016-06-03 DIAGNOSIS — F329 Major depressive disorder, single episode, unspecified: Secondary | ICD-10-CM | POA: Diagnosis not present

## 2016-06-03 DIAGNOSIS — F419 Anxiety disorder, unspecified: Secondary | ICD-10-CM | POA: Diagnosis not present

## 2016-06-03 DIAGNOSIS — G479 Sleep disorder, unspecified: Secondary | ICD-10-CM | POA: Diagnosis not present

## 2016-07-08 IMAGING — US US ABDOMEN LIMITED
1 series · 14 of 25 positions shown · non-contrast
Comparison: CT, ultrasound, and Hepatobiliary scintigraphy dated
07/24/2015

CLINICAL DATA: 39-year-old female with right upper quadrant
abdominal pain.

EXAM:
US ABDOMEN LIMITED - RIGHT UPPER QUADRANT

[Series 1: us abdomen limited · 0.25mm/px · 14 of 41 slices shown]
[im 1/41]
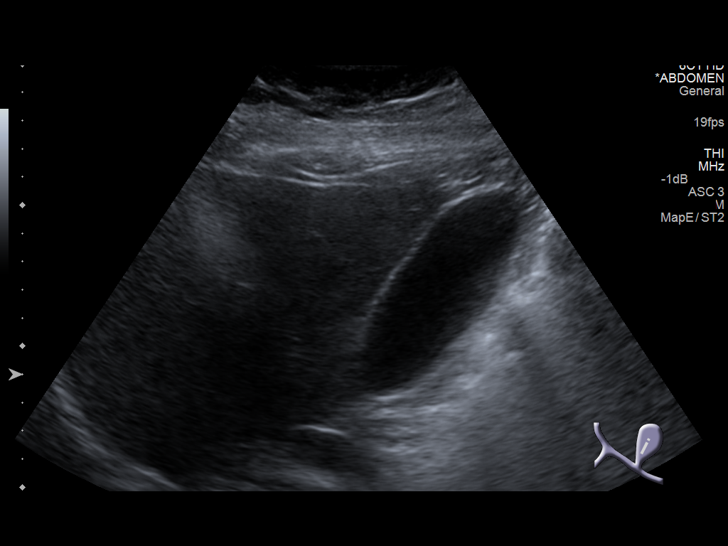
[im 4/41]
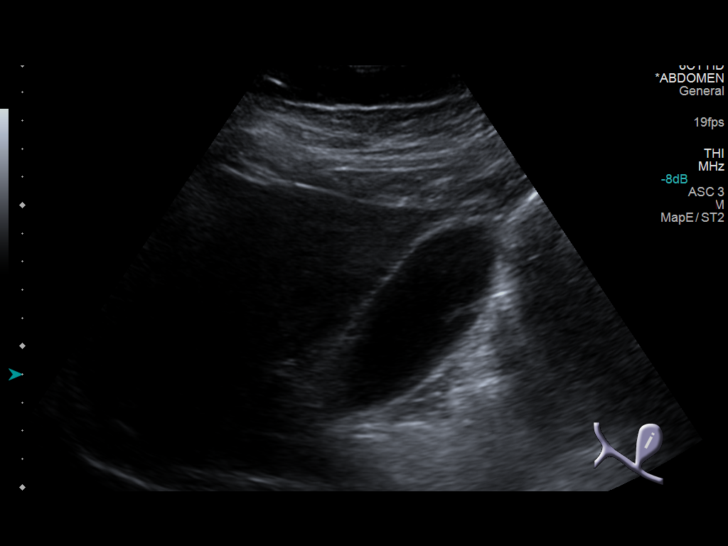
[im 7/41]
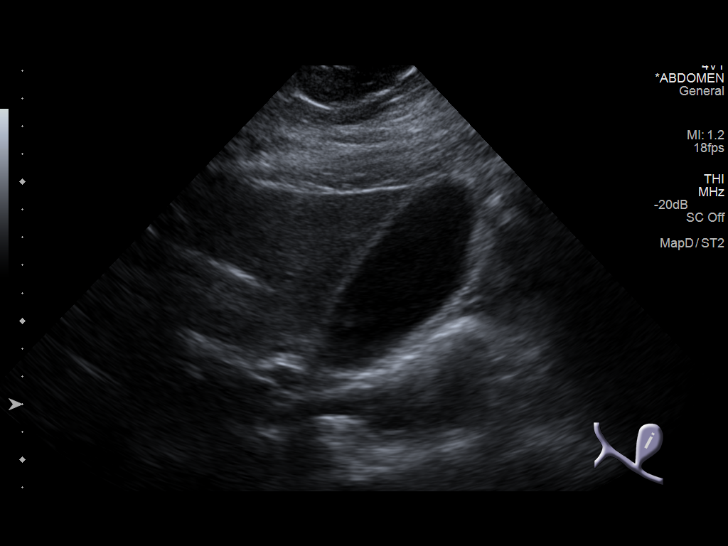
[im 11/41]
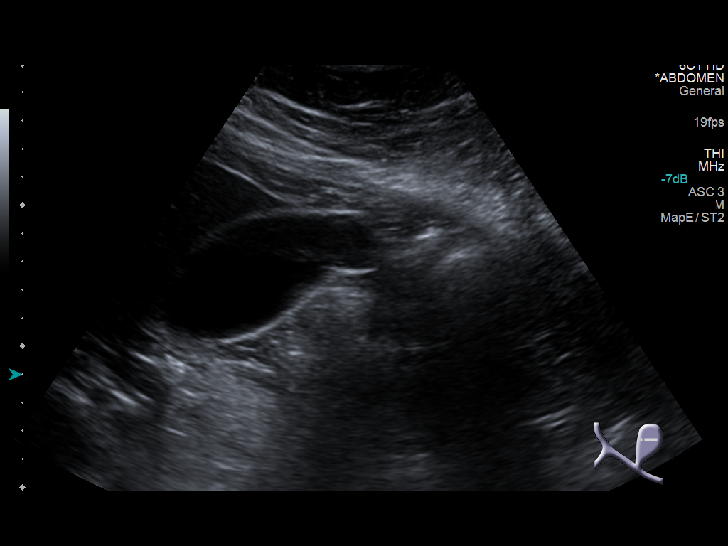
[im 14/41]
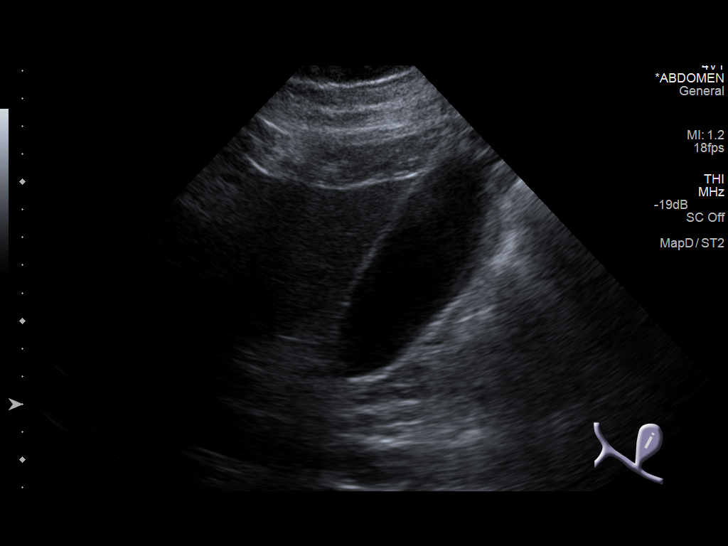
[im 16/41]
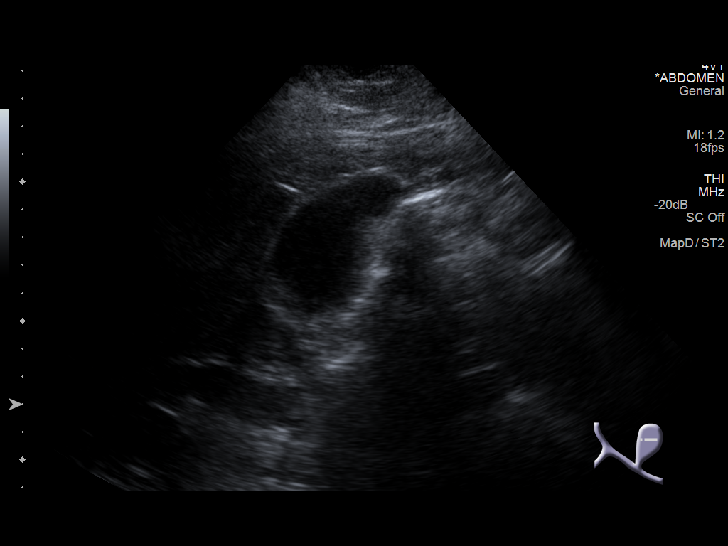
[im 19/41]
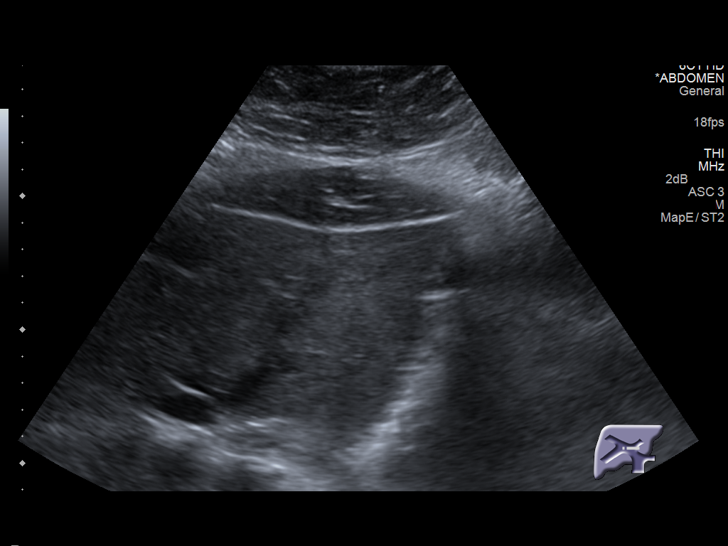
[im 22/41]
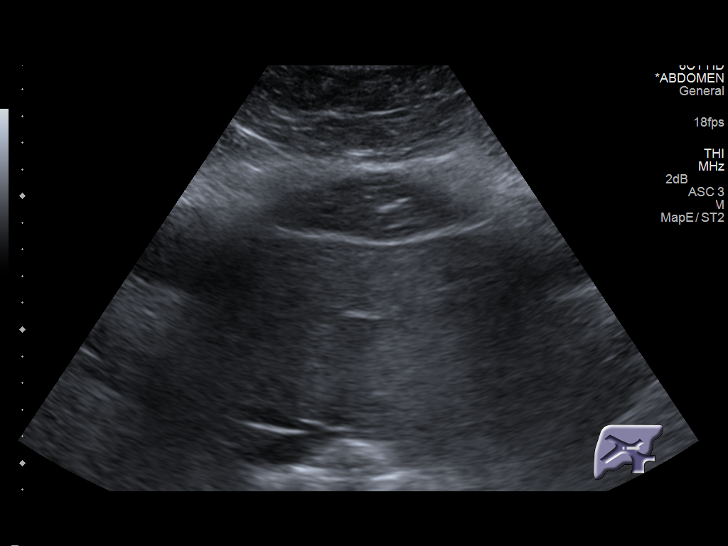
[im 26/41]
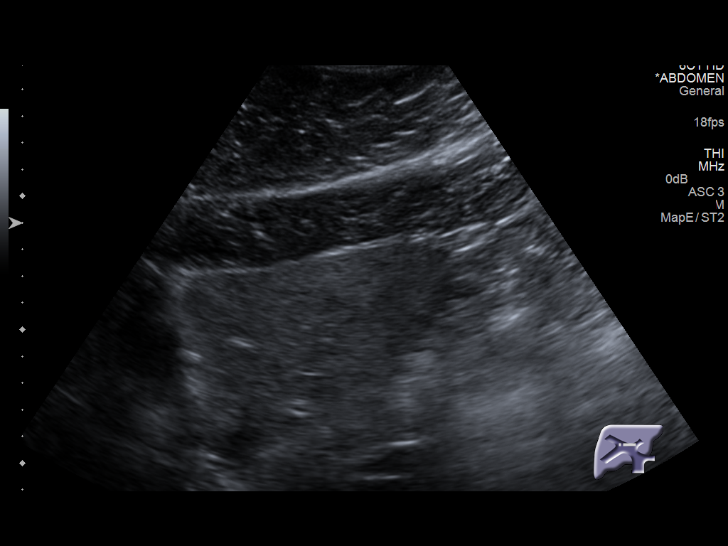
[im 27/41]
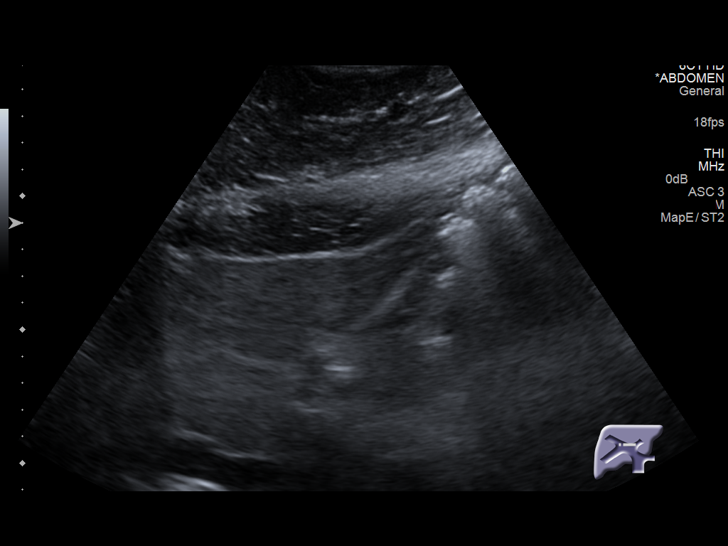
[im 31/41]
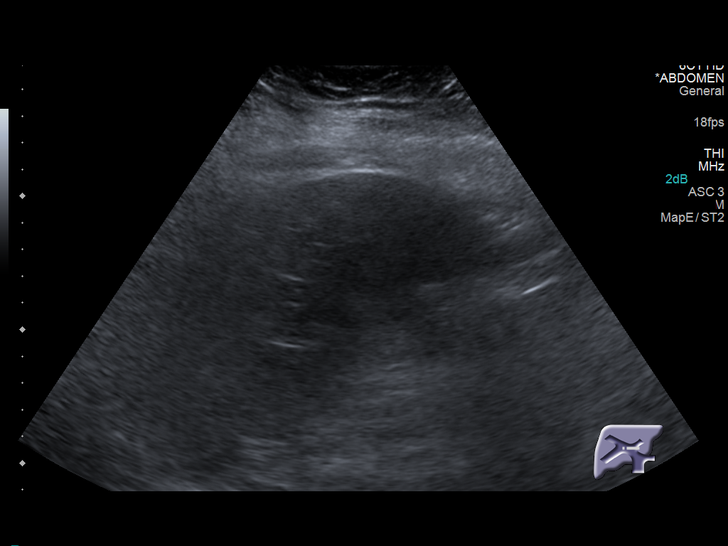
[im 34/41]
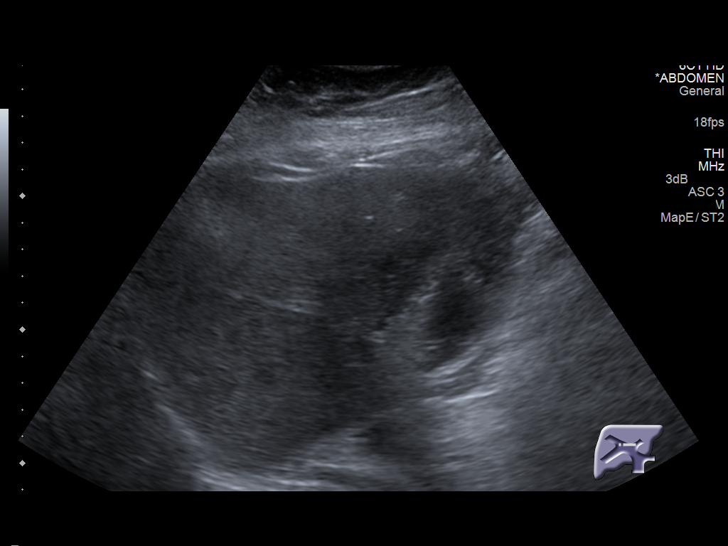
[im 37/41]
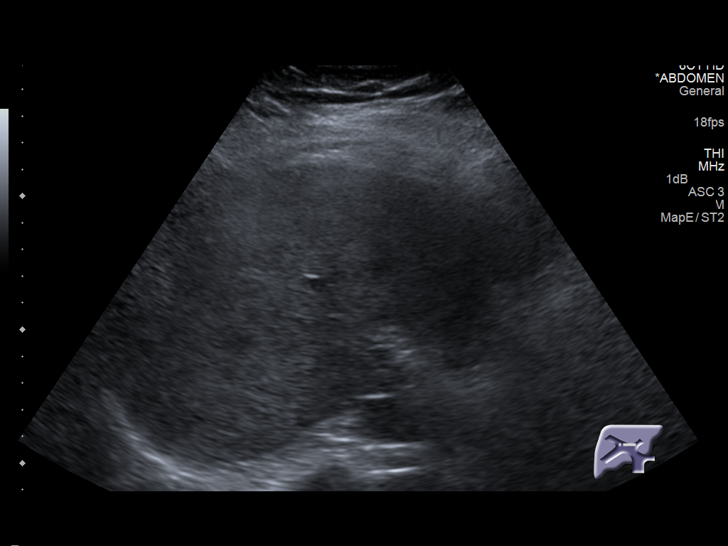
[im 41/41]
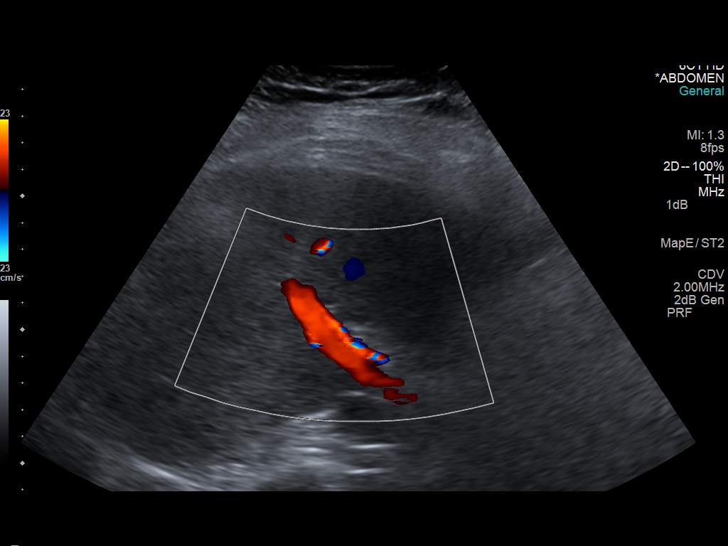

[14 of 25 positions shown; findings below may reference images not displayed]

FINDINGS: Gallbladder:

No gallstones or wall thickening visualized. No sonographic Murphy
sign noted.

Common bile duct:

Diameter: 4 mm

Liver:

There is mild diffuse increase in hepatic echogenicity compatible
with fatty infiltration.
IMPRESSION: No gallstone.

## 2016-09-13 DIAGNOSIS — F339 Major depressive disorder, recurrent, unspecified: Secondary | ICD-10-CM | POA: Diagnosis not present

## 2016-09-13 DIAGNOSIS — G479 Sleep disorder, unspecified: Secondary | ICD-10-CM | POA: Diagnosis not present

## 2016-09-13 DIAGNOSIS — Z720 Tobacco use: Secondary | ICD-10-CM | POA: Diagnosis not present

## 2016-09-13 DIAGNOSIS — F419 Anxiety disorder, unspecified: Secondary | ICD-10-CM | POA: Diagnosis not present

## 2016-10-23 DIAGNOSIS — L989 Disorder of the skin and subcutaneous tissue, unspecified: Secondary | ICD-10-CM | POA: Diagnosis not present

## 2016-10-28 ENCOUNTER — Other Ambulatory Visit: Payer: Self-pay | Admitting: Physician Assistant

## 2016-10-28 DIAGNOSIS — L820 Inflamed seborrheic keratosis: Secondary | ICD-10-CM | POA: Diagnosis not present

## 2017-02-06 DIAGNOSIS — Z0000 Encounter for general adult medical examination without abnormal findings: Secondary | ICD-10-CM | POA: Diagnosis not present

## 2017-02-06 DIAGNOSIS — F339 Major depressive disorder, recurrent, unspecified: Secondary | ICD-10-CM | POA: Diagnosis not present

## 2017-02-06 DIAGNOSIS — F419 Anxiety disorder, unspecified: Secondary | ICD-10-CM | POA: Diagnosis not present

## 2017-02-06 DIAGNOSIS — E039 Hypothyroidism, unspecified: Secondary | ICD-10-CM | POA: Diagnosis not present

## 2017-06-18 DIAGNOSIS — Z6841 Body Mass Index (BMI) 40.0 and over, adult: Secondary | ICD-10-CM | POA: Diagnosis not present

## 2017-06-18 DIAGNOSIS — R070 Pain in throat: Secondary | ICD-10-CM | POA: Diagnosis not present

## 2017-06-18 DIAGNOSIS — R112 Nausea with vomiting, unspecified: Secondary | ICD-10-CM | POA: Diagnosis not present

## 2017-06-18 DIAGNOSIS — R509 Fever, unspecified: Secondary | ICD-10-CM | POA: Diagnosis not present

## 2017-09-30 DIAGNOSIS — F339 Major depressive disorder, recurrent, unspecified: Secondary | ICD-10-CM | POA: Diagnosis not present

## 2017-09-30 DIAGNOSIS — E039 Hypothyroidism, unspecified: Secondary | ICD-10-CM | POA: Diagnosis not present

## 2017-09-30 DIAGNOSIS — G479 Sleep disorder, unspecified: Secondary | ICD-10-CM | POA: Diagnosis not present

## 2017-09-30 DIAGNOSIS — Z23 Encounter for immunization: Secondary | ICD-10-CM | POA: Diagnosis not present

## 2017-09-30 DIAGNOSIS — F419 Anxiety disorder, unspecified: Secondary | ICD-10-CM | POA: Diagnosis not present

## 2018-03-17 DIAGNOSIS — R05 Cough: Secondary | ICD-10-CM | POA: Diagnosis not present

## 2018-03-17 DIAGNOSIS — R112 Nausea with vomiting, unspecified: Secondary | ICD-10-CM | POA: Diagnosis not present

## 2018-03-17 DIAGNOSIS — R0981 Nasal congestion: Secondary | ICD-10-CM | POA: Diagnosis not present

## 2018-03-17 DIAGNOSIS — Z6841 Body Mass Index (BMI) 40.0 and over, adult: Secondary | ICD-10-CM | POA: Diagnosis not present

## 2018-03-17 DIAGNOSIS — R509 Fever, unspecified: Secondary | ICD-10-CM | POA: Diagnosis not present

## 2018-03-20 DIAGNOSIS — R070 Pain in throat: Secondary | ICD-10-CM | POA: Diagnosis not present

## 2018-03-20 DIAGNOSIS — Z6841 Body Mass Index (BMI) 40.0 and over, adult: Secondary | ICD-10-CM | POA: Diagnosis not present

## 2018-03-20 DIAGNOSIS — R112 Nausea with vomiting, unspecified: Secondary | ICD-10-CM | POA: Diagnosis not present

## 2018-04-02 ENCOUNTER — Other Ambulatory Visit: Payer: Self-pay | Admitting: Physician Assistant

## 2018-04-02 ENCOUNTER — Other Ambulatory Visit (HOSPITAL_COMMUNITY)
Admission: RE | Admit: 2018-04-02 | Discharge: 2018-04-02 | Disposition: A | Discharge status: Home / Self Care | Payer: BLUE CROSS/BLUE SHIELD | Source: Ambulatory Visit | Attending: Family Medicine | Admitting: Family Medicine

## 2018-04-02 DIAGNOSIS — Z124 Encounter for screening for malignant neoplasm of cervix: Secondary | ICD-10-CM | POA: Diagnosis not present

## 2018-04-02 DIAGNOSIS — Z01419 Encounter for gynecological examination (general) (routine) without abnormal findings: Secondary | ICD-10-CM | POA: Diagnosis not present

## 2018-04-02 DIAGNOSIS — Z13220 Encounter for screening for lipoid disorders: Secondary | ICD-10-CM | POA: Diagnosis not present

## 2018-04-02 DIAGNOSIS — Z0000 Encounter for general adult medical examination without abnormal findings: Secondary | ICD-10-CM | POA: Diagnosis not present

## 2018-04-02 DIAGNOSIS — E039 Hypothyroidism, unspecified: Secondary | ICD-10-CM | POA: Diagnosis not present

## 2018-04-03 LAB — CYTOLOGY - PAP
ADEQUACY: ABSENT
Diagnosis: NEGATIVE

## 2018-04-15 DIAGNOSIS — Z1231 Encounter for screening mammogram for malignant neoplasm of breast: Secondary | ICD-10-CM | POA: Diagnosis not present

## 2018-04-15 DIAGNOSIS — Z803 Family history of malignant neoplasm of breast: Secondary | ICD-10-CM | POA: Diagnosis not present

## 2018-04-23 DIAGNOSIS — R922 Inconclusive mammogram: Secondary | ICD-10-CM | POA: Diagnosis not present

## 2018-06-12 DIAGNOSIS — E039 Hypothyroidism, unspecified: Secondary | ICD-10-CM | POA: Diagnosis not present

## 2018-08-14 DIAGNOSIS — E039 Hypothyroidism, unspecified: Secondary | ICD-10-CM | POA: Diagnosis not present

## 2018-08-28 DIAGNOSIS — F4320 Adjustment disorder, unspecified: Secondary | ICD-10-CM | POA: Diagnosis not present

## 2018-09-11 DIAGNOSIS — F4320 Adjustment disorder, unspecified: Secondary | ICD-10-CM | POA: Diagnosis not present

## 2018-11-13 DIAGNOSIS — F4320 Adjustment disorder, unspecified: Secondary | ICD-10-CM | POA: Diagnosis not present

## 2018-11-23 DIAGNOSIS — F4320 Adjustment disorder, unspecified: Secondary | ICD-10-CM | POA: Diagnosis not present

## 2018-11-24 DIAGNOSIS — B86 Scabies: Secondary | ICD-10-CM | POA: Diagnosis not present

## 2018-11-24 DIAGNOSIS — Z6841 Body Mass Index (BMI) 40.0 and over, adult: Secondary | ICD-10-CM | POA: Diagnosis not present

## 2018-12-14 DIAGNOSIS — F4320 Adjustment disorder, unspecified: Secondary | ICD-10-CM | POA: Diagnosis not present

## 2018-12-28 DIAGNOSIS — Z23 Encounter for immunization: Secondary | ICD-10-CM | POA: Diagnosis not present

## 2018-12-28 DIAGNOSIS — E039 Hypothyroidism, unspecified: Secondary | ICD-10-CM | POA: Diagnosis not present

## 2018-12-28 DIAGNOSIS — F419 Anxiety disorder, unspecified: Secondary | ICD-10-CM | POA: Diagnosis not present

## 2018-12-28 DIAGNOSIS — F4320 Adjustment disorder, unspecified: Secondary | ICD-10-CM | POA: Diagnosis not present

## 2018-12-28 DIAGNOSIS — F339 Major depressive disorder, recurrent, unspecified: Secondary | ICD-10-CM | POA: Diagnosis not present

## 2019-11-05 DIAGNOSIS — E039 Hypothyroidism, unspecified: Secondary | ICD-10-CM | POA: Diagnosis not present

## 2019-11-05 DIAGNOSIS — Z13220 Encounter for screening for lipoid disorders: Secondary | ICD-10-CM | POA: Diagnosis not present

## 2019-11-05 DIAGNOSIS — F419 Anxiety disorder, unspecified: Secondary | ICD-10-CM | POA: Diagnosis not present

## 2019-11-05 DIAGNOSIS — Z0000 Encounter for general adult medical examination without abnormal findings: Secondary | ICD-10-CM | POA: Diagnosis not present

## 2020-02-11 DIAGNOSIS — Z20828 Contact with and (suspected) exposure to other viral communicable diseases: Secondary | ICD-10-CM | POA: Diagnosis not present

## 2020-06-07 DIAGNOSIS — T7840XA Allergy, unspecified, initial encounter: Secondary | ICD-10-CM | POA: Diagnosis not present

## 2020-06-07 DIAGNOSIS — Z6841 Body Mass Index (BMI) 40.0 and over, adult: Secondary | ICD-10-CM | POA: Diagnosis not present

## 2020-06-07 DIAGNOSIS — L259 Unspecified contact dermatitis, unspecified cause: Secondary | ICD-10-CM | POA: Diagnosis not present

## 2020-08-08 ENCOUNTER — Ambulatory Visit: Payer: Self-pay | Admitting: Allergy and Immunology

## 2020-10-02 DIAGNOSIS — Z20822 Contact with and (suspected) exposure to covid-19: Secondary | ICD-10-CM | POA: Diagnosis not present

## 2020-10-02 DIAGNOSIS — Z03818 Encounter for observation for suspected exposure to other biological agents ruled out: Secondary | ICD-10-CM | POA: Diagnosis not present

## 2020-11-29 DIAGNOSIS — R112 Nausea with vomiting, unspecified: Secondary | ICD-10-CM | POA: Diagnosis not present

## 2020-11-29 DIAGNOSIS — B349 Viral infection, unspecified: Secondary | ICD-10-CM | POA: Diagnosis not present

## 2020-12-29 DIAGNOSIS — Z6841 Body Mass Index (BMI) 40.0 and over, adult: Secondary | ICD-10-CM | POA: Diagnosis not present

## 2020-12-29 DIAGNOSIS — U071 COVID-19: Secondary | ICD-10-CM | POA: Diagnosis not present

## 2021-01-05 DIAGNOSIS — Z029 Encounter for administrative examinations, unspecified: Secondary | ICD-10-CM | POA: Diagnosis not present

## 2021-01-05 DIAGNOSIS — Z8616 Personal history of COVID-19: Secondary | ICD-10-CM | POA: Diagnosis not present

## 2021-02-23 DIAGNOSIS — E039 Hypothyroidism, unspecified: Secondary | ICD-10-CM | POA: Diagnosis not present

## 2021-02-23 DIAGNOSIS — Z23 Encounter for immunization: Secondary | ICD-10-CM | POA: Diagnosis not present

## 2021-02-23 DIAGNOSIS — Z0000 Encounter for general adult medical examination without abnormal findings: Secondary | ICD-10-CM | POA: Diagnosis not present

## 2021-02-23 DIAGNOSIS — Z124 Encounter for screening for malignant neoplasm of cervix: Secondary | ICD-10-CM | POA: Diagnosis not present

## 2021-02-23 DIAGNOSIS — Z13220 Encounter for screening for lipoid disorders: Secondary | ICD-10-CM | POA: Diagnosis not present

## 2021-02-23 DIAGNOSIS — Z131 Encounter for screening for diabetes mellitus: Secondary | ICD-10-CM | POA: Diagnosis not present

## 2021-02-23 DIAGNOSIS — F419 Anxiety disorder, unspecified: Secondary | ICD-10-CM | POA: Diagnosis not present

## 2021-02-23 DIAGNOSIS — F339 Major depressive disorder, recurrent, unspecified: Secondary | ICD-10-CM | POA: Diagnosis not present

## 2021-03-13 DIAGNOSIS — H18213 Corneal edema secondary to contact lens, bilateral: Secondary | ICD-10-CM | POA: Diagnosis not present

## 2021-10-27 DIAGNOSIS — B349 Viral infection, unspecified: Secondary | ICD-10-CM | POA: Diagnosis not present

## 2021-12-10 DIAGNOSIS — J019 Acute sinusitis, unspecified: Secondary | ICD-10-CM | POA: Diagnosis not present

## 2021-12-10 DIAGNOSIS — J0100 Acute maxillary sinusitis, unspecified: Secondary | ICD-10-CM | POA: Diagnosis not present

## 2021-12-10 DIAGNOSIS — J029 Acute pharyngitis, unspecified: Secondary | ICD-10-CM | POA: Diagnosis not present

## 2021-12-10 DIAGNOSIS — J00 Acute nasopharyngitis [common cold]: Secondary | ICD-10-CM | POA: Diagnosis not present

## 2021-12-12 DIAGNOSIS — J029 Acute pharyngitis, unspecified: Secondary | ICD-10-CM | POA: Diagnosis not present

## 2023-04-24 DIAGNOSIS — F419 Anxiety disorder, unspecified: Secondary | ICD-10-CM | POA: Diagnosis not present

## 2023-04-24 DIAGNOSIS — E039 Hypothyroidism, unspecified: Secondary | ICD-10-CM | POA: Diagnosis not present

## 2023-04-24 DIAGNOSIS — K760 Fatty (change of) liver, not elsewhere classified: Secondary | ICD-10-CM | POA: Diagnosis not present

## 2023-04-24 DIAGNOSIS — Z0000 Encounter for general adult medical examination without abnormal findings: Secondary | ICD-10-CM | POA: Diagnosis not present

## 2024-03-26 DIAGNOSIS — L918 Other hypertrophic disorders of the skin: Secondary | ICD-10-CM | POA: Diagnosis not present

## 2024-03-26 DIAGNOSIS — R238 Other skin changes: Secondary | ICD-10-CM | POA: Diagnosis not present

## 2024-03-26 DIAGNOSIS — R234 Changes in skin texture: Secondary | ICD-10-CM | POA: Diagnosis not present

## 2024-04-26 DIAGNOSIS — K760 Fatty (change of) liver, not elsewhere classified: Secondary | ICD-10-CM | POA: Diagnosis not present

## 2024-04-26 DIAGNOSIS — F17210 Nicotine dependence, cigarettes, uncomplicated: Secondary | ICD-10-CM | POA: Diagnosis not present

## 2024-04-26 DIAGNOSIS — Z0000 Encounter for general adult medical examination without abnormal findings: Secondary | ICD-10-CM | POA: Diagnosis not present

## 2024-04-26 DIAGNOSIS — E7800 Pure hypercholesterolemia, unspecified: Secondary | ICD-10-CM | POA: Diagnosis not present

## 2024-04-26 DIAGNOSIS — F419 Anxiety disorder, unspecified: Secondary | ICD-10-CM | POA: Diagnosis not present

## 2024-04-26 DIAGNOSIS — Z113 Encounter for screening for infections with a predominantly sexual mode of transmission: Secondary | ICD-10-CM | POA: Diagnosis not present

## 2024-04-26 DIAGNOSIS — E039 Hypothyroidism, unspecified: Secondary | ICD-10-CM | POA: Diagnosis not present

## 2024-04-26 DIAGNOSIS — Z124 Encounter for screening for malignant neoplasm of cervix: Secondary | ICD-10-CM | POA: Diagnosis not present

## 2024-05-14 DIAGNOSIS — Z1211 Encounter for screening for malignant neoplasm of colon: Secondary | ICD-10-CM | POA: Diagnosis not present

## 2024-06-28 DIAGNOSIS — F419 Anxiety disorder, unspecified: Secondary | ICD-10-CM | POA: Diagnosis not present

## 2024-06-28 DIAGNOSIS — K760 Fatty (change of) liver, not elsewhere classified: Secondary | ICD-10-CM | POA: Diagnosis not present

## 2024-06-28 DIAGNOSIS — E039 Hypothyroidism, unspecified: Secondary | ICD-10-CM | POA: Diagnosis not present

## 2024-06-28 DIAGNOSIS — Z1331 Encounter for screening for depression: Secondary | ICD-10-CM | POA: Diagnosis not present

## 2024-06-28 DIAGNOSIS — E7800 Pure hypercholesterolemia, unspecified: Secondary | ICD-10-CM | POA: Diagnosis not present

## 2024-06-28 DIAGNOSIS — E8889 Other specified metabolic disorders: Secondary | ICD-10-CM | POA: Diagnosis not present

## 2024-07-12 DIAGNOSIS — E039 Hypothyroidism, unspecified: Secondary | ICD-10-CM | POA: Diagnosis not present

## 2024-07-12 DIAGNOSIS — K760 Fatty (change of) liver, not elsewhere classified: Secondary | ICD-10-CM | POA: Diagnosis not present

## 2024-07-12 DIAGNOSIS — E8889 Other specified metabolic disorders: Secondary | ICD-10-CM | POA: Diagnosis not present

## 2024-07-12 DIAGNOSIS — E7800 Pure hypercholesterolemia, unspecified: Secondary | ICD-10-CM | POA: Diagnosis not present

## 2024-07-12 DIAGNOSIS — Z9189 Other specified personal risk factors, not elsewhere classified: Secondary | ICD-10-CM | POA: Diagnosis not present

## 2024-10-14 DIAGNOSIS — K760 Fatty (change of) liver, not elsewhere classified: Secondary | ICD-10-CM | POA: Diagnosis not present

## 2024-10-14 DIAGNOSIS — F419 Anxiety disorder, unspecified: Secondary | ICD-10-CM | POA: Diagnosis not present

## 2024-10-14 DIAGNOSIS — E039 Hypothyroidism, unspecified: Secondary | ICD-10-CM | POA: Diagnosis not present

## 2024-10-14 DIAGNOSIS — E7800 Pure hypercholesterolemia, unspecified: Secondary | ICD-10-CM | POA: Diagnosis not present
